# Patient Record
Sex: Female | Born: 1980 | Race: Black or African American | Hispanic: No | Smoking: Never smoker
Health system: Southern US, Community
[De-identification: ages and names within clinical notes are randomized; demographics above are authoritative.]

## PROBLEM LIST (undated history)

## (undated) ENCOUNTER — Emergency Department (HOSPITAL_BASED_OUTPATIENT_CLINIC_OR_DEPARTMENT_OTHER): Payer: Medicaid Other | Source: Home / Self Care

## (undated) ENCOUNTER — Inpatient Hospital Stay (HOSPITAL_COMMUNITY): Payer: Self-pay

## (undated) DIAGNOSIS — I517 Cardiomegaly: Secondary | ICD-10-CM

## (undated) DIAGNOSIS — Z6791 Unspecified blood type, Rh negative: Secondary | ICD-10-CM

## (undated) DIAGNOSIS — I10 Essential (primary) hypertension: Secondary | ICD-10-CM

## (undated) DIAGNOSIS — D649 Anemia, unspecified: Secondary | ICD-10-CM

## (undated) DIAGNOSIS — R51 Headache: Secondary | ICD-10-CM

## (undated) DIAGNOSIS — O26899 Other specified pregnancy related conditions, unspecified trimester: Secondary | ICD-10-CM

## (undated) HISTORY — PX: TUBAL LIGATION: SHX77

---

## 2002-10-02 ENCOUNTER — Encounter: Payer: Self-pay | Admitting: *Deleted

## 2002-10-02 ENCOUNTER — Ambulatory Visit (HOSPITAL_COMMUNITY): Admission: RE | Admit: 2002-10-02 | Discharge: 2002-10-02 | Payer: Self-pay | Admitting: *Deleted

## 2003-01-11 ENCOUNTER — Inpatient Hospital Stay (HOSPITAL_COMMUNITY): Admission: AD | Admit: 2003-01-11 | Discharge: 2003-01-12 | Payer: Self-pay | Admitting: *Deleted

## 2003-01-19 ENCOUNTER — Inpatient Hospital Stay (HOSPITAL_COMMUNITY): Admission: AD | Admit: 2003-01-19 | Discharge: 2003-01-21 | Payer: Self-pay | Admitting: *Deleted

## 2003-02-09 ENCOUNTER — Emergency Department (HOSPITAL_COMMUNITY): Admission: EM | Admit: 2003-02-09 | Discharge: 2003-02-09 | Payer: Self-pay | Admitting: Emergency Medicine

## 2012-05-21 ENCOUNTER — Encounter (HOSPITAL_COMMUNITY): Payer: Self-pay | Admitting: *Deleted

## 2012-05-21 ENCOUNTER — Inpatient Hospital Stay (HOSPITAL_COMMUNITY)
Admission: AD | Admit: 2012-05-21 | Discharge: 2012-05-21 | Disposition: A | Discharge status: Home / Self Care | Payer: Medicaid - Out of State | Source: Ambulatory Visit | Attending: Obstetrics & Gynecology | Admitting: Obstetrics & Gynecology

## 2012-05-21 DIAGNOSIS — O99891 Other specified diseases and conditions complicating pregnancy: Secondary | ICD-10-CM | POA: Insufficient documentation

## 2012-05-21 DIAGNOSIS — O479 False labor, unspecified: Secondary | ICD-10-CM

## 2012-05-21 DIAGNOSIS — R51 Headache: Secondary | ICD-10-CM | POA: Insufficient documentation

## 2012-05-21 DIAGNOSIS — R109 Unspecified abdominal pain: Secondary | ICD-10-CM | POA: Insufficient documentation

## 2012-05-21 DIAGNOSIS — O26899 Other specified pregnancy related conditions, unspecified trimester: Secondary | ICD-10-CM | POA: Diagnosis present

## 2012-05-21 DIAGNOSIS — O0930 Supervision of pregnancy with insufficient antenatal care, unspecified trimester: Secondary | ICD-10-CM | POA: Insufficient documentation

## 2012-05-21 DIAGNOSIS — O4700 False labor before 37 completed weeks of gestation, unspecified trimester: Secondary | ICD-10-CM | POA: Insufficient documentation

## 2012-05-21 HISTORY — DX: Unspecified blood type, rh negative: Z67.91

## 2012-05-21 HISTORY — DX: Anemia, unspecified: D64.9

## 2012-05-21 HISTORY — DX: Headache: R51

## 2012-05-21 HISTORY — DX: Other specified pregnancy related conditions, unspecified trimester: O26.899

## 2012-05-21 LAB — WET PREP, GENITAL
Clue Cells Wet Prep HPF POC: NONE SEEN
Trich, Wet Prep: NONE SEEN
Yeast Wet Prep HPF POC: NONE SEEN

## 2012-05-21 LAB — URINALYSIS, ROUTINE W REFLEX MICROSCOPIC
Bilirubin Urine: NEGATIVE
Glucose, UA: NEGATIVE mg/dL
Hgb urine dipstick: NEGATIVE
Ketones, ur: NEGATIVE mg/dL
Nitrite: NEGATIVE
Protein, ur: NEGATIVE mg/dL
Specific Gravity, Urine: 1.01 (ref 1.005–1.030)
Urobilinogen, UA: 1 mg/dL (ref 0.0–1.0)
pH: 7 (ref 5.0–8.0)

## 2012-05-21 LAB — URINE MICROSCOPIC-ADD ON

## 2012-05-21 NOTE — MAU Provider Note (Signed)
Chief Complaint:  Headache and Abdominal Pain   None     HPI: Christina Bruce is a 31 y.o. [phone] at 36w6dwho presents to maternity admissions reporting episodes one time each day of cramping/contractions, especially after walking or being on her feet.  These cramps go away when she rests and drinks fluids.  She has not had prenatal care in this pregnancy other than an initial visit in [place].  She reports good fetal movement, denies LOF, vaginal bleeding, vaginal itching/burning, urinary symptoms, h/a, dizziness, n/v, or fever/chills.       Past Medical History: Past Medical History  Diagnosis Date  . Anemia   . Rh negative, maternal   . Headache     Past obstetric history: G1: Vaginal delivery at term G2: C/S with twins at term G3: C/S at term G55: TAB G5: Current pregnancy  Past Surgical History: Past Surgical History  Procedure Date  . Cesarean section     Family History: Family History  Problem Relation Age of Onset  . Other Neg Hx     Social History: History  Substance Use Topics  . Smoking status: Never Smoker   . Smokeless tobacco: Not on file  . Alcohol Use: No    Allergies:  Allergies  Allergen Reactions  . Penicillins Hives    Meds:  Prescriptions prior to admission  Medication Sig Dispense Refill  . acetaminophen (TYLENOL) 325 MG tablet Take 325 mg by mouth every 6 (six) hours as needed. Takes for pain      . Prenatal Vit-Fe Fumarate-FA (PRENATAL MULTIVITAMIN) TABS Take 1 tablet by mouth every morning.        ROS: Pertinent findings in history of present illness.  Physical Exam  Blood pressure 120/84, pulse 104, temperature 98.1 F (36.7 C), temperature source Oral, resp. rate 16, height 5' (1.524 m), weight 77.111 kg (170 lb). GENERAL: Well-developed, well-nourished female in no acute distress.  HEENT: normocephalic HEART: normal rate RESP: normal effort ABDOMEN: Soft, non-tender, gravid appropriate for gestational age EXTREMITIES:  Nontender, no edema NEURO: alert and oriented Pelvic exam: Cervix pink, visually closed, without lesion, scant white creamy discharge, vaginal walls and external genitalia normal  Dilation: Closed Effacement (%): Thick Station:  (high) Exam by:: L Leftwich-Kirby CNM  FHT:  Baseline 150 , moderate variability, accelerations present (10x10), no decelerations Contractions: None   Labs: Results for orders placed during the hospital encounter of 05/21/12 (from the past 24 hour(s))  URINALYSIS, ROUTINE W REFLEX MICROSCOPIC     Status: Abnormal   Collection Time   05/21/12  1:27 PM      Component Value Range   Color, Urine YELLOW  YELLOW   APPearance HAZY (*) CLEAR   Specific Gravity, Urine 1.010  1.005 - 1.030   pH 7.0  5.0 - 8.0   Glucose, UA NEGATIVE  NEGATIVE mg/dL   Hgb urine dipstick NEGATIVE  NEGATIVE   Bilirubin Urine NEGATIVE  NEGATIVE   Ketones, ur NEGATIVE  NEGATIVE mg/dL   Protein, ur NEGATIVE  NEGATIVE mg/dL   Urobilinogen, UA 1.0  0.0 - 1.0 mg/dL   Nitrite NEGATIVE  NEGATIVE   Leukocytes, UA TRACE (*) NEGATIVE  URINE MICROSCOPIC-ADD ON     Status: Abnormal   Collection Time   05/21/12  1:27 PM      Component Value Range   Squamous Epithelial / LPF RARE  RARE   WBC, UA 3-6  <3 WBC/hpf   RBC / HPF 0-2  <3 RBC/hpf   Bacteria,  UA FEW (*) RARE  WET PREP, GENITAL     Status: Abnormal   Collection Time   05/21/12  3:20 PM      Component Value Range   Yeast Wet Prep HPF POC NONE SEEN  NONE SEEN   Trich, Wet Prep NONE SEEN  NONE SEEN   Clue Cells Wet Prep HPF POC NONE SEEN  NONE SEEN   WBC, Wet Prep HPF POC FEW (*) NONE SEEN    Assessment: 1. Prenatal care insufficient   2. Braxton Hicks contractions   3. Headache in pregnancy    Plan: Discharge home Tylenol for h/a, increase PO fluids PTL precautions and fetal kick counts Message sent to [facility] clinic Outpatient U/S scheduled Return to MAU as needed  Medication List  As of 05/21/2012  3:25 PM   ASK your doctor about  these medications         acetaminophen 325 MG tablet   Commonly known as: TYLENOL   Take 325 mg by mouth every 6 (six) hours as needed. Takes for pain      prenatal multivitamin Tabs   Take 1 tablet by mouth every morning.            Sharen Counter Certified Nurse-Midwife 05/21/2012 3:25 PM

## 2012-05-21 NOTE — MAU Note (Signed)
C/o headaches and cramping for past week; recently moved here to care for her mother; limited prenatal care in [place];

## 2012-05-22 LAB — GC/CHLAMYDIA PROBE AMP, GENITAL
Chlamydia, DNA Probe: NEGATIVE
GC Probe Amp, Genital: NEGATIVE

## 2012-05-27 ENCOUNTER — Ambulatory Visit (HOSPITAL_COMMUNITY)
Admission: RE | Admit: 2012-05-27 | Discharge: 2012-05-27 | Disposition: A | Discharge status: Home / Self Care | Payer: Medicaid - Out of State | Source: Ambulatory Visit | Attending: Advanced Practice Midwife | Admitting: Advanced Practice Midwife

## 2012-05-27 DIAGNOSIS — O0930 Supervision of pregnancy with insufficient antenatal care, unspecified trimester: Secondary | ICD-10-CM | POA: Insufficient documentation

## 2012-05-27 DIAGNOSIS — Z363 Encounter for antenatal screening for malformations: Secondary | ICD-10-CM | POA: Insufficient documentation

## 2012-05-27 DIAGNOSIS — Z1389 Encounter for screening for other disorder: Secondary | ICD-10-CM | POA: Insufficient documentation

## 2012-05-27 DIAGNOSIS — O34219 Maternal care for unspecified type scar from previous cesarean delivery: Secondary | ICD-10-CM | POA: Insufficient documentation

## 2012-05-27 DIAGNOSIS — O358XX0 Maternal care for other (suspected) fetal abnormality and damage, not applicable or unspecified: Secondary | ICD-10-CM | POA: Insufficient documentation

## 2012-06-04 ENCOUNTER — Ambulatory Visit (INDEPENDENT_AMBULATORY_CARE_PROVIDER_SITE_OTHER): Payer: Medicaid - Out of State | Admitting: Advanced Practice Midwife

## 2012-06-04 ENCOUNTER — Encounter: Payer: Self-pay | Admitting: Advanced Practice Midwife

## 2012-06-04 VITALS — BP 111/74 | Temp 98.1°F | Wt 195.7 lb

## 2012-06-04 DIAGNOSIS — M5431 Sciatica, right side: Secondary | ICD-10-CM | POA: Insufficient documentation

## 2012-06-04 DIAGNOSIS — O2390 Unspecified genitourinary tract infection in pregnancy, unspecified trimester: Secondary | ICD-10-CM

## 2012-06-04 DIAGNOSIS — Z23 Encounter for immunization: Secondary | ICD-10-CM

## 2012-06-04 DIAGNOSIS — M5430 Sciatica, unspecified side: Secondary | ICD-10-CM

## 2012-06-04 DIAGNOSIS — N390 Urinary tract infection, site not specified: Secondary | ICD-10-CM

## 2012-06-04 DIAGNOSIS — Z3480 Encounter for supervision of other normal pregnancy, unspecified trimester: Secondary | ICD-10-CM | POA: Insufficient documentation

## 2012-06-04 DIAGNOSIS — O0930 Supervision of pregnancy with insufficient antenatal care, unspecified trimester: Secondary | ICD-10-CM

## 2012-06-04 DIAGNOSIS — O2340 Unspecified infection of urinary tract in pregnancy, unspecified trimester: Secondary | ICD-10-CM | POA: Insufficient documentation

## 2012-06-04 LAB — POCT URINALYSIS DIP (DEVICE)
Bilirubin Urine: NEGATIVE
Hgb urine dipstick: NEGATIVE
Ketones, ur: NEGATIVE mg/dL
pH: 6 (ref 5.0–8.0)

## 2012-06-04 MED ORDER — NITROFURANTOIN MONOHYD MACRO 100 MG PO CAPS: 100.0000 mg | ORAL_CAPSULE | Freq: Two times a day (BID) | ORAL | Status: DC

## 2012-06-04 MED ORDER — TETANUS-DIPHTH-ACELL PERTUSSIS 5-2.5-18.5 LF-MCG/0.5 IM SUSP: 0.5000 mL | Freq: Once | INTRAMUSCULAR | Status: DC

## 2012-06-04 NOTE — Progress Notes (Signed)
P= 91, Here for initial ob. States was taking a pink birth control pill when got pregnant and stopped taking pills. Was 2 months pregnant when she found out at a doctors appointment that was because her period was coming/stopping/. C/o 1+edema feet, Given new patient information, Discussed appropriate weight gain based on her bmi- patient has already surpassed- discussed eating healthy and trying not to gain a lot more. Interested in getting tdap today.

## 2012-06-04 NOTE — Progress Notes (Signed)
Subjective:    Christina Bruce is a [id number] [redacted]w[redacted]d being seen today for her first obstetrical visit.  Her obstetrical history is significant for C/S x2. Patient does intend to breast feed. Pregnancy history fully reviewed.    Patient reports sciatic pain in right leg.  Filed Vitals:   06/04/12 0833  BP: 111/74  Temp: 98.1 F (36.7 C)  Weight: 88.769 kg (195 lb 11.2 oz)    HISTORY: OB History    Grav Para Term Preterm Abortions TAB SAB Ect Mult Living   6 4 4  1  0   1 5     # Outc Date GA Lbr Len/2nd Wgt Sex Del Anes PTL Lv   1 TRM 12/02 [redacted]w[redacted]d  7lb2oz(3.232kg) F SVD EPI     Comments: no complications, delivered in [place]   2 [facility] 5/04 [redacted]w[redacted]d  7lb4oz(3.289kg) F SVD EPI     Comments: no complications, born at [facility] hospital   3 [facility] 5/06 [redacted]w[redacted]d  8lb14oz(4.026kg) M CS EPI     Comments: c/s due to breech or unable to vaginally deliver- no complications , born at [place]   4A [facility] 3/08 [redacted]w[redacted]d  7lb6oz(3.345kg) F       Comments: Twins, born at [place], no complications   4B  3/08 [redacted]w[redacted]d  7lb11oz(3.487kg) F CS EPI     5 ABT 5/12 [redacted]w[redacted]d          Comments: no complications   6 CUR              Past Medical History  Diagnosis Date  . Anemia   . Rh negative, maternal   . Headache    Past Surgical History  Procedure Date  . Cesarean section    Family History  Problem Relation Age of Onset  . Other Neg Hx   . Hypertension Mother   . Diabetes Mother      Exam    Uterus:   23 cm fundal height  Pelvic Exam: Deferred--done in MAU   Perineum: Deferred   Vulva: Deferred   Vagina:  Deferred   pH: N/A   Cervix: Deferred   Adnexa: not evaluated   Bony Pelvis: average  System: Breast:  normal appearance, no masses or tenderness   Skin: normal coloration and turgor, no rashes    Neurologic: oriented, normal   Extremities: normal strength, tone, and muscle mass   HEENT PERRLA   Mouth/Teeth mucous membranes moist, pharynx normal without lesions   Neck  supple and no masses   Cardiovascular: regular rate and rhythm   Respiratory:  appears well, vitals normal, no respiratory distress, acyanotic, normal RR, ear and throat exam is normal, neck free of mass or lymphadenopathy, chest clear, no wheezing, crepitations, rhonchi, normal symmetric air entry   Abdomen: soft, non-tender; bowel sounds normal; no masses,  no organomegaly   Urinary: Deferred      Assessment:    Pregnancy: [id number] 1. Prenatal care insufficient   2. Sciatica of right side   3. UTI in pregnancy   4. Supervision of normal subsequent pregnancy   5. Need for prophylactic vaccination and inoculation against influenza         Plan:     Initial labs not drawn. Draw at next visit Prenatal vitamins. Problem list reviewed and updated. Genetic Screening discussed/late to caredeclined.  Ultrasound discussed; fetal survey normal  Follow up in 2 weeks. Macrobid 100 mg BID x7 days Urine culture sent 75%  of 30 min visit spent on counseling and coordination of care.     LEFTWICH-KIRBY, Dorissa Stinnette 06/04/2012

## 2012-06-04 NOTE — Patient Instructions (Signed)
Sciatica Sciatica is a weakness and/or changes in sensation (tingling, jolts, hot and cold, numbness) along the path the sciatic nerve travels. Irritation or damage to lumbar nerve roots is often also referred to as lumbar radiculopathy.  Lumbar radiculopathy (Sciatica) is the most common form of this problem. Radiculopathy can occur in any of the nerves coming out of the spinal cord. The problems caused depend on which nerves are involved. The sciatic nerve is the large nerve supplying the branches of nerves going from the hip to the toes. It often causes a numbness or weakness in the skin and/or muscles that the sciatic nerve serves. It also may cause symptoms (problems) of pain, burning, tingling, or electric shock-like feelings in the path of this nerve. This usually comes from injury to the fibers that make up the sciatic nerve. Some of these symptoms are low back pain and/or unpleasant feelings in the following areas:  From the mid-buttock down the back of the leg to the back of the knee.   And/or the outside of the calf and top of the foot.   And/or behind the inner ankle to the sole of the foot.  CAUSES   Herniated or slipped disc. Discs are the little cushions between the bones in the back.   Pressure by the piriformis muscle in the buttock on the sciatic nerve (Piriformis Syndrome).   Misalignment of the bones in the lower back and buttocks (Sacroiliac Joint Derangement).   Narrowing of the spinal canal that puts pressure on or pinches the fibers that make up the sciatic nerve.   A slipped vertebra that is out of line with those above or beneath it.   Abnormality of the nervous system itself so that nerve fibers do not transmit signals properly, especially to feet and calves (neuropathy).   Tumor (this is rare).  Your caregiver can usually determine the cause of your sciatica and begin the treatment most likely to help you. TREATMENT  Taking over-the-counter painkillers, physical  therapy, rest, exercise, spinal manipulation, and injections of anesthetics and/or steroids may be used. Surgery, acupuncture, and Yoga can also be effective. Mind over matter techniques, mental imagery, and changing factors such as your bed, chair, desk height, posture, and activities are other treatments that may be helpful. You and your caregiver can help determine what is best for you. With proper diagnosis, the cause of most sciatica can be identified and removed. Communication and cooperation between your caregiver and you is essential. If you are not successful immediately, do not be discouraged. With time, a proper treatment can be found that will make you comfortable. HOME CARE INSTRUCTIONS   If the pain is coming from a problem in the back, applying ice to that area for 15 to 20 minutes, 3 to 4 times per day while awake, may be helpful. Put the ice in a plastic bag. Place a towel between the bag of ice and your skin.   You may exercise or perform your usual activities if these do not aggravate your pain, or as suggested by your caregiver.   Only take over-the-counter or prescription medicines for pain, discomfort, or fever as directed by your caregiver.   If your caregiver has given you a follow-up appointment, it is very important to keep that appointment. Not keeping the appointment could result in a chronic or permanent injury, pain, and disability. If there is any problem keeping the appointment, you must call back to this facility for assistance.  SEEK IMMEDIATE MEDICAL CARE   IF:   You experience loss of control of bowel or bladder.   You have increasing weakness in the trunk, buttocks, or legs.   There is numbness in any areas from the hip down to the toes.   You have difficulty walking or keeping your balance.   You have any of the above, with fever or forceful vomiting.  Document Released: 08/28/2001 Document Revised: 08/23/2011 Document Reviewed: 04/16/2008 [facility] Patient  Information 2012 [place], [place].  Urinary Tract Infection in Pregnancy A urinary tract infection (UTI) is a bacterial infection of the urinary tract. Infection of the urinary tract can include the ureters, kidneys (pyelonephritis), bladder (cystitis), and urethra (urethritis). All pregnant women should be screened for bacteria in the urinary tract. Identifying and treating a UTI will decrease the risk of preterm labor and developing more serious infections in both the mother and baby. CAUSES Bacteria germs cause almost all UTIs. There are many factors that can increase your chances of getting a UTI during pregnancy. These include:  Having a short urethra.   Poor toilet and hygiene habits.   Sexual intercourse.   Blockage of urine along the urinary tract.   Problems with the pelvic muscles or nerves.   Diabetes.   Obesity.   Bladder problems after having several children.   Previous history of UTI.  SYMPTOMS   Pain, burning, or a stinging feeling when urinating.   Suddenly feeling the need to urinate right away (urgency).   Loss of bladder control (urinary incontinence).   Frequent urination, more than is common with pregnancy.   Lower abdominal or back discomfort.   Bad smelling urine.   Cloudy urine.   Blood in the urine (hematuria).   Fever.  When the kidneys are infected, the symptoms may be:  Back pain.   Flank pain on the right side more so than the left.   Fever.   Chills.   Nausea.   Vomiting.  DIAGNOSIS   Urine tests.   Additional tests and procedures may include:   Ultrasound of the kidneys, ureters, bladder, and urethra.   Looking in the bladder with a lighted tube (cystoscopy).   Certain X-ray studies only when absolutely necessary.  Finding out the results of your test Ask when your test results will be ready. Make sure you get your test results. TREATMENT  Antibiotic medicine by mouth.   Antibiotics given through the vein  (intravenously), if needed.  HOME CARE INSTRUCTIONS   Take your antibiotics as directed. Finish them even if you start to feel better. Only take medicine as directed by your caregiver.   Drink enough fluids to keep your urine clear or pale yellow.   Do not have sexual intercourse until the infection is gone and your caregiver says it is okay.   Make sure you are tested for UTIs throughout your pregnancy if you get one. These infections often come back.  Preventing a UTI in the future:  Practice good toilet habits. Always wipe from front to back. Use the tissue only once.   Do not hold your urine. Empty your bladder as soon as possible when the urge comes.   Do not douche or use deodorant sprays.   Wash with soap and warm water around the genital area and the anus.   Empty your bladder before and after sexual intercourse.   Wear underwear with a cotton crotch.   Avoid caffeine and carbonated drinks. They can irritate the bladder.   Drink cranberry juice or take cranberry  pills. This may decrease the risk of getting a UTI.   Do not drink alcohol.   Keep all your appointments and tests as scheduled.  SEEK MEDICAL CARE IF:   Your symptoms get worse.   You are still having fevers 2 or more days after treatment begins.   You develop a rash.   You feel that you are having problems with medicines prescribed.   You develop abnormal vaginal discharge.  SEEK IMMEDIATE MEDICAL CARE IF:   You develop back or flank pain.   You develop chills.   You have blood in your urine.   You develop nausea and vomiting.   You develop contractions of your uterus.   You have a gush of fluid from the vagina.  MAKE SURE YOU:   Understand these instructions.   Will watch your condition.   Will get help right away if you are not doing well or get worse.  Document Released: 12/29/2010 Document Revised: 08/23/2011 Document Reviewed: 12/29/2010 [facility] Patient Information 2012  [place], [place].

## 2012-06-05 LAB — OBSTETRIC PANEL
Eosinophils Relative: 0 % (ref 0–5)
Hepatitis B Surface Ag: NEGATIVE
Lymphocytes Relative: 21 % (ref 12–46)
Lymphs Abs: 1.6 10*3/uL (ref 0.7–4.0)
MCV: 73.2 fL — ABNORMAL LOW (ref 78.0–100.0)
Neutro Abs: 5.1 10*3/uL (ref 1.7–7.7)
Platelets: 259 10*3/uL (ref 150–400)
RBC: 3.85 MIL/uL — ABNORMAL LOW (ref 3.87–5.11)
Rubella: 61 IU/mL — ABNORMAL HIGH
WBC: 7.5 10*3/uL (ref 4.0–10.5)

## 2012-06-05 LAB — HIV ANTIBODY (ROUTINE TESTING W REFLEX): HIV: NONREACTIVE

## 2012-06-08 LAB — CULTURE, OB URINE

## 2012-06-18 ENCOUNTER — Encounter: Payer: Medicaid - Out of State | Admitting: Obstetrics and Gynecology

## 2012-11-01 ENCOUNTER — Other Ambulatory Visit: Payer: Self-pay

## 2013-07-23 ENCOUNTER — Other Ambulatory Visit: Payer: Self-pay

## 2014-07-19 ENCOUNTER — Encounter: Payer: Self-pay | Admitting: Advanced Practice Midwife

## 2014-09-17 ENCOUNTER — Encounter (HOSPITAL_COMMUNITY): Payer: Self-pay | Admitting: Emergency Medicine

## 2014-09-17 ENCOUNTER — Emergency Department (INDEPENDENT_AMBULATORY_CARE_PROVIDER_SITE_OTHER)
Admission: EM | Admit: 2014-09-17 | Discharge: 2014-09-17 | Disposition: A | Discharge status: Home / Self Care | Payer: PRIVATE HEALTH INSURANCE | Source: Home / Self Care | Attending: Family Medicine | Admitting: Family Medicine

## 2014-09-17 DIAGNOSIS — W19XXXA Unspecified fall, initial encounter: Secondary | ICD-10-CM

## 2014-09-17 DIAGNOSIS — Y92199 Unspecified place in other specified residential institution as the place of occurrence of the external cause: Secondary | ICD-10-CM

## 2014-09-17 DIAGNOSIS — M542 Cervicalgia: Secondary | ICD-10-CM

## 2014-09-17 MED ORDER — IBUPROFEN 800 MG PO TABS
ORAL_TABLET | ORAL | Status: AC
  Filled 2014-09-17: qty 1

## 2014-09-17 MED ORDER — IBUPROFEN 800 MG PO TABS
800.0000 mg | ORAL_TABLET | Freq: Once | ORAL | Status: AC
  Administered 2014-09-17: 800 mg via ORAL

## 2014-09-17 NOTE — ED Provider Notes (Signed)
CSN: [id number]     Arrival date & time 09/17/14  1305 History   First MD Initiated Contact with Patient 09/17/14 1318     No chief complaint on file.  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 Was carrying a container filled with gravy across the kitchen and slipped and fell. Landed on buttocks then fell backwards hitting back and head on floor. Gravy spilled on face, chest and upper arms. No LOC. Was advised by her director to be seen at our facility.  Reports herself to be otherwise healthy PCP: [facility] in [place], [place]   The history is provided by the patient.    Past Medical History  Diagnosis Date  . Anemia   . Rh negative, maternal   . Headache(784.0)    Past Surgical History  Procedure Laterality Date  . Cesarean section     Family History  Problem Relation Age of Onset  . Other Neg Hx   . Hypertension Mother   . Diabetes Mother    History  Substance Use Topics  . Smoking status: Never Smoker   . Smokeless tobacco: Never Used  . Alcohol Use: No   OB History    Gravida Para Term Preterm AB TAB SAB Ectopic Multiple Living   0   1 5     Review of Systems  All other systems reviewed and are negative.   Allergies  Penicillins  Home Medications   Prior to Admission medications   Medication Sig Start Date End Date Taking? Authorizing Provider  acetaminophen (TYLENOL) 325 MG tablet Take 325 mg by mouth every 6 (six) hours as needed. Takes for pain    Historical Provider, MD  nitrofurantoin, macrocrystal-monohydrate, (MACROBID) 100 MG capsule Take 1 capsule (100 mg total) by mouth 2 (two) times daily. 06/04/12   Hurshel Party, CNM  Prenatal Vit-Fe Fumarate-FA (PRENATAL MULTIVITAMIN) TABS Take 1 tablet by mouth every morning.    Historical Provider, MD   BP 143/94 mmHg  Pulse 71  Temp(Src) 98.2 F  (36.8 C) (Oral)  Resp 16  SpO2 99%  LMP 08/29/2014  Breastfeeding? Unknown Physical Exam  Constitutional: She is oriented to person, place, and time. She appears well-developed and well-nourished.  HENT:  Head: Normocephalic and atraumatic.  Right Ear: Hearing and external ear normal.  Left Ear: Hearing and external ear normal.  Nose: Nose normal.  Mouth/Throat: Uvula is midline, oropharynx is clear and moist and mucous membranes are normal.  Eyes: Conjunctivae and EOM are normal. Pupils are equal, round, and reactive to light.  Neck: Trachea normal, normal range of motion, full passive range of motion without pain and phonation normal. Neck supple. Muscular tenderness present. No spinous process tenderness present. No erythema and normal range of motion present.    Outlined area are regions of minor discomfort  Cardiovascular: Normal rate, regular rhythm and normal heart sounds.   Pulmonary/Chest: Effort normal and breath sounds normal.  Abdominal: Soft. Bowel sounds are normal. She exhibits no distension. There is no tenderness.  Musculoskeletal: Normal range of motion.  Neurological: She is alert and oriented to person, place, and time.  Skin: Skin is warm and dry.  Small linear area ofsuperficial erythema at right anterior upper arm.  Psychiatric: She has a normal mood and affect. Her behavior is normal.  Nursing note and vitals reviewed.   ED Course  Procedures (including critical care time) Labs  Review Labs Reviewed - No data to display  Imaging Review No results found.   MDM   1. Fall from standing, initial encounter    Head injury precautions reviewed with patient. Advised patient that she could expect to be stiff and sore for the next few days. Ibuprofen as directed on packaging for discomfort. Return prn.   Ria Clock, PA 09/17/14 1352

## 2014-09-17 NOTE — ED Notes (Signed)
Pain in neck and shoulders.  Larey Seat today while carrying a pan of gravy.  Supervisor was notified per patient: incident report completed

## 2014-09-17 NOTE — Discharge Instructions (Signed)
Monitor your symptoms at home. Tylenol or ibuprofen as directed on packaging for discomfort Expect to be stiff and sore for the next few days If symptoms do not improve, please follow up at [facility]

## 2014-09-18 ENCOUNTER — Emergency Department (HOSPITAL_COMMUNITY): Payer: PRIVATE HEALTH INSURANCE

## 2014-09-18 ENCOUNTER — Emergency Department (HOSPITAL_COMMUNITY)
Admission: EM | Admit: 2014-09-18 | Discharge: 2014-09-18 | Disposition: A | Discharge status: Home / Self Care | Payer: PRIVATE HEALTH INSURANCE | Attending: Emergency Medicine | Admitting: Emergency Medicine

## 2014-09-18 ENCOUNTER — Encounter (HOSPITAL_COMMUNITY): Payer: Self-pay | Admitting: Emergency Medicine

## 2014-09-18 DIAGNOSIS — M62838 Other muscle spasm: Secondary | ICD-10-CM | POA: Diagnosis not present

## 2014-09-18 DIAGNOSIS — M542 Cervicalgia: Secondary | ICD-10-CM | POA: Diagnosis not present

## 2014-09-18 DIAGNOSIS — Z79899 Other long term (current) drug therapy: Secondary | ICD-10-CM | POA: Insufficient documentation

## 2014-09-18 DIAGNOSIS — M545 Low back pain: Secondary | ICD-10-CM | POA: Insufficient documentation

## 2014-09-18 DIAGNOSIS — M546 Pain in thoracic spine: Secondary | ICD-10-CM | POA: Insufficient documentation

## 2014-09-18 DIAGNOSIS — Z880 Allergy status to penicillin: Secondary | ICD-10-CM | POA: Diagnosis not present

## 2014-09-18 DIAGNOSIS — M549 Dorsalgia, unspecified: Secondary | ICD-10-CM

## 2014-09-18 DIAGNOSIS — G8911 Acute pain due to trauma: Secondary | ICD-10-CM | POA: Diagnosis not present

## 2014-09-18 DIAGNOSIS — Z862 Personal history of diseases of the blood and blood-forming organs and certain disorders involving the immune mechanism: Secondary | ICD-10-CM | POA: Insufficient documentation

## 2014-09-18 MED ORDER — OXYCODONE-ACETAMINOPHEN 5-325 MG PO TABS
1.0000 | ORAL_TABLET | Freq: Once | ORAL | Status: AC
Start: 2014-09-18 — End: 2014-09-18
  Administered 2014-09-18: 1 via ORAL
  Filled 2014-09-18: qty 1

## 2014-09-18 MED ORDER — METHOCARBAMOL 500 MG PO TABS
500.0000 mg | ORAL_TABLET | Freq: Once | ORAL | Status: AC
  Administered 2014-09-18: 500 mg via ORAL
  Filled 2014-09-18: qty 1

## 2014-09-18 MED ORDER — NAPROXEN 500 MG PO TABS: 500.0000 mg | ORAL_TABLET | Freq: Two times a day (BID) | ORAL | Status: DC

## 2014-09-18 MED ORDER — METHOCARBAMOL 500 MG PO TABS: 500.0000 mg | ORAL_TABLET | Freq: Two times a day (BID) | ORAL | Status: DC

## 2014-09-18 NOTE — Discharge Instructions (Signed)
Recommend alternating ice and heat to areas of injury. Take Naproxen and robaxin as prescribed for symptoms. Follow up with your primary doctor for a recheck of symptoms in 1 week.  Muscle Cramps and Spasms Muscle cramps and spasms occur when a muscle or muscles tighten and you have no control over this tightening (involuntary muscle contraction). They are a common problem and can develop in any muscle. The most common place is in the calf muscles of the leg. Both muscle cramps and muscle spasms are involuntary muscle contractions, but they also have differences:   Muscle cramps are sporadic and painful. They may last a few seconds to a quarter of an hour. Muscle cramps are often more forceful and last longer than muscle spasms.  Muscle spasms may or may not be painful. They may also last just a few seconds or much longer. CAUSES  It is uncommon for cramps or spasms to be due to a serious underlying problem. In many cases, the cause of cramps or spasms is unknown. Some common causes are:   Overexertion.   Overuse from repetitive motions (doing the same thing over and over).   Remaining in a certain position for a long period of time.   Improper preparation, form, or technique while performing a sport or activity.   Dehydration.   Injury.   Side effects of some medicines.   Abnormally low levels of the salts and ions in your blood (electrolytes), especially potassium and calcium. This could happen if you are taking water pills (diuretics) or you are pregnant.  Some underlying medical problems can make it more likely to develop cramps or spasms. These include, but are not limited to:   Diabetes.   Parkinson disease.   Hormone disorders, such as thyroid problems.   Alcohol abuse.   Diseases specific to muscles, joints, and bones.   Blood vessel disease where not enough blood is getting to the muscles.  HOME CARE INSTRUCTIONS   Stay well hydrated. Drink enough water  and fluids to keep your urine clear or pale yellow.  It may be helpful to massage, stretch, and relax the affected muscle.  For tight or tense muscles, use a warm towel, heating pad, or hot shower water directed to the affected area.  If you are sore or have pain after a cramp or spasm, applying ice to the affected area may relieve discomfort.  Put ice in a plastic bag.  Place a towel between your skin and the bag.  Leave the ice on for 15-20 minutes, 03-04 times a day.  Medicines used to treat a known cause of cramps or spasms may help reduce their frequency or severity. Only take over-the-counter or prescription medicines as directed by your caregiver. SEEK MEDICAL CARE IF:  Your cramps or spasms get more severe, more frequent, or do not improve over time.  MAKE SURE YOU:   Understand these instructions.  Will watch your condition.  Will get help right away if you are not doing well or get worse. Document Released: 02/23/2002 Document Revised: 12/29/2012 Document Reviewed: 08/20/2012 [facility] Patient Information 2015 [place], [place]. This information is not intended to replace advice given to you by your health care provider. Make sure you discuss any questions you have with your health care provider. Muscle Strain A muscle strain is an injury that occurs when a muscle is stretched beyond its normal length. Usually a small number of muscle fibers are torn when this happens. Muscle strain is rated in degrees. First-degree  strains have the least amount of muscle fiber tearing and pain. Second-degree and third-degree strains have increasingly more tearing and pain.  Usually, recovery from muscle strain takes 1-2 weeks. Complete healing takes 5-6 weeks.  CAUSES  Muscle strain happens when a sudden, violent force placed on a muscle stretches it too far. This may occur with lifting, sports, or a fall.  RISK FACTORS Muscle strain is especially common in athletes.  SIGNS AND SYMPTOMS At  the site of the muscle strain, there may be:  Pain.  Bruising.  Swelling.  Difficulty using the muscle due to pain or lack of normal function. DIAGNOSIS  Your health care provider will perform a physical exam and ask about your medical history. TREATMENT  Often, the best treatment for a muscle strain is resting, icing, and applying cold compresses to the injured area.  HOME CARE INSTRUCTIONS   Use the PRICE method of treatment to promote muscle healing during the first 2-3 days after your injury. The PRICE method involves:  Protecting the muscle from being injured again.  Restricting your activity and resting the injured body part.  Icing your injury. To do this, put ice in a plastic bag. Place a towel between your skin and the bag. Then, apply the ice and leave it on from 15-20 minutes each hour. After the third day, switch to moist heat packs.  Apply compression to the injured area with a splint or elastic bandage. Be careful not to wrap it too tightly. This may interfere with blood circulation or increase swelling.  Elevate the injured body part above the level of your heart as often as you can.  Only take over-the-counter or prescription medicines for pain, discomfort, or fever as directed by your health care provider.  Warming up prior to exercise helps to prevent future muscle strains. SEEK MEDICAL CARE IF:   You have increasing pain or swelling in the injured area.  You have numbness, tingling, or a significant loss of strength in the injured area. MAKE SURE YOU:   Understand these instructions.  Will watch your condition.  Will get help right away if you are not doing well or get worse. Document Released: 09/03/2005 Document Revised: 06/24/2013 Document Reviewed: 04/02/2013 [facility] Patient Information 2015 [place], [place]. This information is not intended to replace advice given to you by your health care provider. Make sure you discuss any questions you have with  your health care provider.

## 2014-09-18 NOTE — ED Notes (Signed)
Pt has a ride home.  

## 2014-09-18 NOTE — ED Provider Notes (Signed)
CSN: [id number]     Arrival date & time 09/18/14  1935 History  This chart was scribed for non-physician practitioner working with Arby Barrette, MD by Elveria Rising, ED Scribe. This patient was seen in room WTR5/WTR5 and the patient's care was started at 8:19 PM.   Chief Complaint  Patient presents with  . Fall  . Back Pain  . Neck Pain   The history is provided by the patient. No language interpreter was used.   HPI Comments: Christina Bruce is a 34 y.o. female who presents to the Emergency Department with multiple complaints after a fall at work in [employer]'s kitchen yesterday. Patient slipped while transporting pan filled with gravy across the kitchen; patient landed on her buttocks then flat on her back. Patient reports striking her head on landing but denies loss of consciousness. Patient evaluated yesterday for the fall at [facility]. No imaging performed. Patient informed that she could expect to be stiff and sore for the next few days and discharged with 800 mg ibuprofen. Patient is now complaining of neck pain and stiffness, back pain described as aching and headache. Patient reports difficulty moving her neck due to pain severity. Patient has not taken any ibuprofen or any other medication. Patient states that she was instructed to return today if her pain did not improve. Patient reports that her pain is aggravated by left lateral rotation and sitting up straight. Patient denies bladder/bowel incontinence or numbness/ weakness in her upper or lower extremities.   Past Medical History  Diagnosis Date  . Anemia   . Rh negative, maternal   . Headache(784.0)    Past Surgical History  Procedure Laterality Date  . Cesarean section     Family History  Problem Relation Age of Onset  . Other Neg Hx   . Hypertension Mother   . Diabetes Mother    History  Substance Use Topics  . Smoking status: Never Smoker   . Smokeless tobacco: Never Used  . Alcohol Use: No   OB History     Gravida Para Term Preterm AB TAB SAB Ectopic Multiple Living   0   1 5     Review of Systems  Constitutional: Negative for fever and chills.  Eyes: Negative for visual disturbance.  Gastrointestinal: Negative for nausea and vomiting.  Musculoskeletal: Positive for myalgias, back pain and neck pain.  Skin: Negative for wound.  Neurological: Positive for headaches.   Allergies  Penicillins  Home Medications   Prior to Admission medications   Medication Sig Start Date End Date Taking? Authorizing Provider  methocarbamol (ROBAXIN) 500 MG tablet Take 1 tablet (500 mg total) by mouth 2 (two) times daily. 09/18/14   Antony Madura, PA-C  naproxen (NAPROSYN) 500 MG tablet Take 1 tablet (500 mg total) by mouth 2 (two) times daily. 09/18/14   Antony Madura, PA-C  nitrofurantoin, macrocrystal-monohydrate, (MACROBID) 100 MG capsule Take 1 capsule (100 mg total) by mouth 2 (two) times daily. Patient not taking: Reported on 09/18/2014 06/04/12   Wilmer Floor Leftwich-Kirby, CNM   Triage Vitals: BP 153/83 mmHg  Pulse 83  Temp(Src) 99 F (37.2 C) (Oral)  Resp 18  SpO2 100%  LMP 08/29/2014  Physical Exam  Constitutional: She is oriented to person, place, and time. She appears well-developed and well-nourished. No distress.  Nontoxic/nonseptic appearing.  HENT:  Head: Normocephalic and atraumatic.  Mouth/Throat: Oropharynx is clear and moist.  Eyes: Conjunctivae and EOM are normal. Pupils  are equal, round, and reactive to light. No scleral icterus.  Neck: Normal range of motion. Neck supple. No tracheal deviation present.  Tenderness to cervical paraspinal muscles bilaterally. There is an appreciable spasm on the right. No bony deformities, step-offs, or crepitus. No midline tenderness.  Cardiovascular: Normal rate, regular rhythm and intact distal pulses.   Distal radial pulse 2+ bilaterally  Pulmonary/Chest: Effort normal. No respiratory distress.  Respirations even and unlabored.   Musculoskeletal: Normal range of motion. She exhibits tenderness.  Tenderness diffusely throughout back. No bony deformities, step-offs, or crepitus to the thoracic or lumbar midline.  Neurological: She is alert and oriented to person, place, and time. No cranial nerve deficit. She exhibits normal muscle tone. Coordination normal.  GCS 15. Speech is goal oriented. No focal neurologic deficits appreciated. Patient has equal grip strength bilaterally and 5/5 strength against resistance in all major muscle groups bilaterally. She ambulates independently with steady gait.  Skin: Skin is warm and dry. No rash noted. She is not diaphoretic. No erythema. No pallor.  Psychiatric: She has a normal mood and affect. Her behavior is normal.  Nursing note and vitals reviewed.   ED Course  Procedures (including critical care time)  COORDINATION OF CARE: 8:26 PM- Plans to obtain imaging of back. Discussed treatment plan with patient at bedside and patient agreed to plan.   Labs Review Labs Reviewed - No data to display  Imaging Review Dg Cervical Spine With Flex & Extend  09/18/2014   CLINICAL DATA:  Fall yesterday. Now with back pain and neck stiffness. Initial evaluation.  EXAM: CERVICAL SPINE COMPLETE WITH FLEXION AND EXTENSION VIEWS  COMPARISON:  None.  FINDINGS: There is mild straightening of the normal cervical lordosis. No listhesis. Vertebral body heights maintained. Prevertebral soft tissues normal. No translational motion with flexion and extension. No fracture.  No significant degenerative changes identified.  IMPRESSION: No acute abnormality within the cervical spine.   Electronically Signed   By: Rise Mu M.D.   On: 09/18/2014 22:17   Dg Thoracic Spine 2 View  09/18/2014   CLINICAL DATA:  Fall yesterday. Now with back pain and neck stiffness. Initial evaluation.  EXAM: THORACIC SPINE - 2 VIEW  COMPARISON:  None.  FINDINGS: There is no evidence of thoracic spine fracture. Mild  dextroscoliosis. Alignment otherwise unremarkable. No other significant bone abnormalities are identified.  IMPRESSION: Negative.   Electronically Signed   By: Rise Mu M.D.   On: 09/18/2014 22:20   Dg Lumbar Spine Complete  09/18/2014   CLINICAL DATA:  Fall yesterday. Now with back pain and neck stiffness. Initial evaluation.  EXAM: LUMBAR SPINE - COMPLETE 4+ VIEW  COMPARISON:  None.  FINDINGS: There is no evidence of lumbar spine fracture. Alignment is normal. Intervertebral disc spaces are maintained.  IMPRESSION: Negative.   Electronically Signed   By: Rise Mu M.D.   On: 09/18/2014 22:23     EKG Interpretation None      MDM   Final diagnoses:  Neck pain  Back pain  Muscle spasms of neck    Patient presents to the emergency department for further evaluation of injuries following a fall yesterday. Patient was evaluated at urgent care for symptoms and did not receive any imaging. She has not taken anything for her symptoms since her prior evaluation. She is neurovascularly intact. No concussive symptoms. Neurologic exam nonfocal. No indication for emergent CT head. No red flags or signs concerning for cauda equina. X-ray was obtained of C/T/L spine, all  of which are negative. Patient has had improvement in symptoms over ED course with Robaxin, Percocet, and heat pad. Do not believe further emergent workup is indicated. Will manage symptoms as outpatient with NSAIDs and Robaxin. Return precautions discussed and provided. Patient agreeable to plan with no unaddressed concerns.  I personally performed the services described in this documentation, which was scribed in my presence. The recorded information has been reviewed and is accurate.   Filed Vitals:   09/18/14 1941  BP: 153/83  Pulse: 83  Temp: 99 F (37.2 C)  TempSrc: Oral  Resp: 18  SpO2: 100%     Antony Madura, PA-C 09/18/14 2233  Arby Barrette, MD 09/19/14 [phone]

## 2014-09-18 NOTE — ED Notes (Signed)
Patient transported to X-ray 

## 2014-09-18 NOTE — ED Notes (Signed)
Pt states that she fell yesterday while carrying a pan of gravy.  States that she has back pain and neck stiffness today. When asked about a visit this nurse noted in her chart from yesterday for same incident, pt states she is upset because "they didn't do no xrays or nothing and they just told me to take ibuprofen".

## 2014-09-27 ENCOUNTER — Other Ambulatory Visit: Payer: Self-pay | Admitting: Occupational Medicine

## 2014-09-27 ENCOUNTER — Ambulatory Visit (HOSPITAL_COMMUNITY): Admission: RE | Admit: 2014-09-27 | Payer: Medicaid - Out of State | Source: Ambulatory Visit

## 2014-09-27 DIAGNOSIS — T148XXA Other injury of unspecified body region, initial encounter: Secondary | ICD-10-CM

## 2014-09-29 ENCOUNTER — Ambulatory Visit (HOSPITAL_COMMUNITY)
Admission: RE | Admit: 2014-09-29 | Discharge: 2014-09-29 | Disposition: A | Discharge status: Home / Self Care | Payer: PRIVATE HEALTH INSURANCE | Source: Ambulatory Visit | Attending: Occupational Medicine | Admitting: Occupational Medicine

## 2014-09-29 DIAGNOSIS — M79601 Pain in right arm: Secondary | ICD-10-CM | POA: Diagnosis not present

## 2014-09-29 DIAGNOSIS — M542 Cervicalgia: Secondary | ICD-10-CM | POA: Diagnosis present

## 2014-09-29 DIAGNOSIS — R937 Abnormal findings on diagnostic imaging of other parts of musculoskeletal system: Secondary | ICD-10-CM | POA: Insufficient documentation

## 2014-09-29 DIAGNOSIS — T148XXA Other injury of unspecified body region, initial encounter: Secondary | ICD-10-CM

## 2014-09-29 DIAGNOSIS — M47894 Other spondylosis, thoracic region: Secondary | ICD-10-CM | POA: Insufficient documentation

## 2014-12-13 ENCOUNTER — Ambulatory Visit: Payer: Medicaid - Out of State | Admitting: Diagnostic Neuroimaging

## 2014-12-14 ENCOUNTER — Encounter: Payer: Self-pay | Admitting: Diagnostic Neuroimaging

## 2015-06-22 DIAGNOSIS — M5412 Radiculopathy, cervical region: Secondary | ICD-10-CM | POA: Insufficient documentation

## 2015-07-07 DIAGNOSIS — F321 Major depressive disorder, single episode, moderate: Secondary | ICD-10-CM | POA: Insufficient documentation

## 2015-08-31 ENCOUNTER — Ambulatory Visit (INDEPENDENT_AMBULATORY_CARE_PROVIDER_SITE_OTHER): Payer: Self-pay | Admitting: Neurology

## 2015-08-31 DIAGNOSIS — R69 Illness, unspecified: Secondary | ICD-10-CM

## 2015-08-31 NOTE — Progress Notes (Deleted)
NEUROLOGY CLINIC NEW PATIENT NOTE  NAME: Christina Bruce DOB: 05/19/1981 REFERRING PHYSICIAN: Verlon AuBoyd, Tammy Lamonica, MD  I saw Christina Bruce as a new consult in the neurovascular clinic today regarding No chief complaint on file.   HPI: Christina Bruce is a 34 y.o. female with PMH of *** who presents as a new patient for {neuro cv c/o:11025}.   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 Was carrying a container filled with gravy across the kitchen and slipped and fell. Landed on buttocks then fell backwards hitting back and head on floor. Gravy spilled on face, chest and upper arms. No LOC. Was advised by her director to be [facility].  Reports herself to be otherwise healthy.   Patient 2nd day [facility] complaining of neck pain and stiffness, back pain described as aching and headache. Patient reports difficulty moving her neck due to pain severity. Patient has not taken any ibuprofen or any other medication. Patient states that she was instructed to return today if her pain did not improve. Patient reports that her pain is aggravated by left lateral rotation and sitting up straight. Patient denies bladder/bowel incontinence or numbness/ weakness in her upper or lower extremities. Had whole spin X-ray were negative. Given robaxin, percocet and heat pad, symptoms improved. Discharged with NSAIDs and robaxin. *** Had cervical MRI 09/29/14 showed mild disc bulging at C5-7.   Pt continued to have symptoms ***.    Past Medical History  Diagnosis Date  . Anemia   . Rh negative, maternal   . Headache(784.0)    Past Surgical History  Procedure Laterality Date  . Cesarean section     Family History  Problem Relation Age of Onset  . Other Neg Hx   . Hypertension Mother   . Diabetes Mother    Current Outpatient Prescriptions  Medication Sig Dispense Refill  . methocarbamol (ROBAXIN) 500 MG tablet Take 1 tablet (500 mg total) by mouth 2 (two) times daily.  20 tablet 0  . naproxen (NAPROSYN) 500 MG tablet Take 1 tablet (500 mg total) by mouth 2 (two) times daily. 30 tablet 0  . nitrofurantoin, macrocrystal-monohydrate, (MACROBID) 100 MG capsule Take 1 capsule (100 mg total) by mouth 2 (two) times daily. (Patient not taking: Reported on 09/18/2014) 14 capsule 0   No current facility-administered medications for this visit.   Allergies  Allergen Reactions  . Penicillins Hives   Social History   Social History  . Marital Status: Single    Spouse Name: N/A  . Number of Children: N/A  . Years of Education: N/A   Occupational History  . Not on file.   Social History Main Topics  . Smoking status: Never Smoker   . Smokeless tobacco: Never Used  . Alcohol Use: No  . Drug Use: No  . Sexual Activity: Yes    Birth Control/ Protection: Pill   Other Topics Concern  . Not on file   Social History Narrative    Review of Systems Full 14 system review of systems performed and notable only for those listed, all others are neg:  Constitutional:   Cardiovascular:  Ear/Nose/Throat:   Skin:  Eyes:   Respiratory:   Gastroitestinal:   Genitourinary:  Hematology/Lymphatic:   Endocrine:  Musculoskeletal:   Allergy/Immunology:   Neurological:   Psychiatric:  Sleep:    Physical Exam  There were no vitals filed for this visit.  General - Well nourished,  well developed, in no apparent distress***.  Ophthalmologic - Sharp disc margins OU***.  Cardiovascular - Regular rate and rhythm with no murmur***. Carotid pulses were 2+ without bruits ***.   Neck - supple, no nuchal rigidity ***.  Mental Status -  Level of arousal and orientation to time, place, and person were intact***. Language including expression, naming, repetition, comprehension, reading, and writing was assessed and found intact***. Attention span and concentration were normal***. Recent and remote memory were intact***. Fund of Knowledge was assessed and was  intact***.  Cranial Nerves II - XII - II - Visual field intact OU***. III, IV, VI - Extraocular movements intact***. V - Facial sensation intact bilaterally***. VII - Facial movement intact bilaterally***. VIII - Hearing & vestibular intact bilaterally***. X - Palate elevates symmetrically***. XI - Chin turning & shoulder shrug intact bilaterally***. XII - Tongue protrusion intact***.  Motor Strength - The patient's strength was normal in all extremities and pronator drift was absent.  Bulk was normal and fasciculations were absent***.   Motor Tone - Muscle tone was assessed at the neck and appendages and was normal***.  Reflexes - The patient's reflexes were normal in all extremities and she had no pathological reflexes***.  Sensory - Light touch, temperature/pinprick, vibration and proprioception, and Romberg testing were assessed and were normal***.    Coordination - The patient had normal movements in the hands and feet with no ataxia or dysmetria.  Tremor was absent***.  Gait and Station - The patient's transfers, posture, gait, station, and turns were observed as normal***.   Imaging  I have personally reviewed the radiological images below and agree with the radiology interpretations.  ***  Lab Review ***  Assessments: NIHSS: {0-42:17997} Rankin: {0-5:140013}   Assessment and Plan:   In summary, CHIANTI GOH is a 34 y.o. female with PMH of *** presents    I recommend aggressive blood pressure control with a goal <130/80 mm Hg.  Lipids should be managed intensively, with a goal LDL < 70 mg/dL.  I encouraged the patient to discuss these important issues with her primary care physician.  I counseled the patient on measures to reduce stroke risk, including the importance of medication compliance, risk factor control, exercise, healthy diet, and avoidance of smoking.  I reviewed stroke warning signs and symptoms and appropriate actions to take if such occurs.   Thank  you very much for the opportunity to participate in the care of this patient.  Please do not hesitate to call if any questions or concerns arise.  No orders of the defined types were placed in this encounter.    No orders of the defined types were placed in this encounter.    There are no Patient Instructions on file for this visit.  Marvel Plan, MD PhD [facility] [address], [address] [place], [place] [address] [phone]

## 2015-09-01 ENCOUNTER — Encounter: Payer: Self-pay | Admitting: Neurology

## 2015-09-27 ENCOUNTER — Encounter: Payer: Self-pay | Admitting: Neurology

## 2015-09-27 NOTE — Progress Notes (Signed)
No show

## 2015-12-06 DIAGNOSIS — G43009 Migraine without aura, not intractable, without status migrainosus: Secondary | ICD-10-CM | POA: Insufficient documentation

## 2015-12-08 DIAGNOSIS — R42 Dizziness and giddiness: Secondary | ICD-10-CM | POA: Insufficient documentation

## 2015-12-08 DIAGNOSIS — G5601 Carpal tunnel syndrome, right upper limb: Secondary | ICD-10-CM | POA: Insufficient documentation

## 2016-08-25 ENCOUNTER — Encounter: Payer: Self-pay | Admitting: Emergency Medicine

## 2016-08-25 ENCOUNTER — Emergency Department
Admission: EM | Admit: 2016-08-25 | Discharge: 2016-08-25 | Disposition: A | Discharge status: Home / Self Care | Payer: Medicaid Other | Attending: Emergency Medicine | Admitting: Emergency Medicine

## 2016-08-25 ENCOUNTER — Emergency Department: Payer: Medicaid Other

## 2016-08-25 DIAGNOSIS — Z791 Long term (current) use of non-steroidal anti-inflammatories (NSAID): Secondary | ICD-10-CM | POA: Diagnosis not present

## 2016-08-25 DIAGNOSIS — N390 Urinary tract infection, site not specified: Secondary | ICD-10-CM | POA: Diagnosis not present

## 2016-08-25 DIAGNOSIS — R109 Unspecified abdominal pain: Secondary | ICD-10-CM

## 2016-08-25 DIAGNOSIS — R1032 Left lower quadrant pain: Secondary | ICD-10-CM | POA: Diagnosis present

## 2016-08-25 LAB — URINALYSIS, COMPLETE (UACMP) WITH MICROSCOPIC
BILIRUBIN URINE: NEGATIVE
GLUCOSE, UA: NEGATIVE mg/dL
KETONES UR: NEGATIVE mg/dL
Nitrite: POSITIVE — AB
PH: 5 (ref 5.0–8.0)
Protein, ur: 100 mg/dL — AB
SPECIFIC GRAVITY, URINE: 1.02 (ref 1.005–1.030)

## 2016-08-25 LAB — CBC WITH DIFFERENTIAL/PLATELET
BASOS ABS: 0 10*3/uL (ref 0–0.1)
Basophils Relative: 1 %
EOS ABS: 0 10*3/uL (ref 0–0.7)
EOS PCT: 0 %
HCT: 40 % (ref 35.0–47.0)
Hemoglobin: 13.2 g/dL (ref 12.0–16.0)
LYMPHS PCT: 19 %
Lymphs Abs: 1.2 10*3/uL (ref 1.0–3.6)
MCH: 24.3 pg — AB (ref 26.0–34.0)
MCHC: 32.9 g/dL (ref 32.0–36.0)
MCV: 74 fL — AB (ref 80.0–100.0)
MONO ABS: 1 10*3/uL — AB (ref 0.2–0.9)
Monocytes Relative: 14 %
Neutro Abs: 4.5 10*3/uL (ref 1.4–6.5)
Neutrophils Relative %: 66 %
PLATELETS: 309 10*3/uL (ref 150–440)
RBC: 5.4 MIL/uL — AB (ref 3.80–5.20)
RDW: 16.3 % — AB (ref 11.5–14.5)
WBC: 6.7 10*3/uL (ref 3.6–11.0)

## 2016-08-25 LAB — COMPREHENSIVE METABOLIC PANEL
ALT: 28 U/L (ref 14–54)
AST: 28 U/L (ref 15–41)
Albumin: 3.9 g/dL (ref 3.5–5.0)
Alkaline Phosphatase: 135 U/L — ABNORMAL HIGH (ref 38–126)
Anion gap: 9 (ref 5–15)
BUN: 7 mg/dL (ref 6–20)
CHLORIDE: 100 mmol/L — AB (ref 101–111)
CO2: 26 mmol/L (ref 22–32)
Calcium: 9.6 mg/dL (ref 8.9–10.3)
Creatinine, Ser: 0.87 mg/dL (ref 0.44–1.00)
Glucose, Bld: 118 mg/dL — ABNORMAL HIGH (ref 65–99)
POTASSIUM: 3.5 mmol/L (ref 3.5–5.1)
SODIUM: 135 mmol/L (ref 135–145)
Total Bilirubin: 0.7 mg/dL (ref 0.3–1.2)
Total Protein: 8.8 g/dL — ABNORMAL HIGH (ref 6.5–8.1)

## 2016-08-25 LAB — LIPASE, BLOOD: LIPASE: 17 U/L (ref 11–51)

## 2016-08-25 LAB — POCT PREGNANCY, URINE: Preg Test, Ur: NEGATIVE

## 2016-08-25 MED ORDER — NITROFURANTOIN MACROCRYSTAL 100 MG PO CAPS: 100.0000 mg | ORAL_CAPSULE | Freq: Four times a day (QID) | ORAL | 0 refills | Status: AC

## 2016-08-25 NOTE — ED Provider Notes (Signed)
Time Seen: Approximately 1724 I have reviewed the triage notes  Chief Complaint: Abdominal Pain   History of Present Illness: Christina Bruce is a 35 y.o. female who presents with 24 hours of left-sided abdominal pain that has gotten progressively worse. She denies any nausea, vomiting. States she's had some low-grade fevers at home up to 100.3. She states she took some ibuprofen prior to arrival which does help with both the pain and the fever. She denies any dysuria, hematuria or urinary frequency. She denies any vaginal discharge or bleeding. She denies any right-sided abdominal pain.   Past Medical History:  Diagnosis Date  . Anemia   . Headache(784.0)   . Rh negative, maternal     Patient Active Problem List   Diagnosis Date Noted  . Sciatica of right side 06/04/2012  . UTI in pregnancy 06/04/2012  . Supervision of normal subsequent pregnancy 06/04/2012  . Prenatal care insufficient 05/21/2012  . Headache in pregnancy 05/21/2012    Past Surgical History:  Procedure Laterality Date  . CESAREAN SECTION      Past Surgical History:  Procedure Laterality Date  . CESAREAN SECTION      Current Outpatient Rx  . Order #: [id number] Class: Print  . Order #: [id number] Class: Print  . Order #: [id number] Class: Print  . Order #: [id number] Class: Print    Allergies:  Penicillins  Family History: Family History  Problem Relation Age of Onset  . Hypertension Mother   . Diabetes Mother   . Other Neg Hx     Social History: Social History  Substance Use Topics  . Smoking status: Never Smoker  . Smokeless tobacco: Never Used  . Alcohol use No     Review of Systems:   10 point review of systems was performed and was otherwise negative:  Constitutional: No fever Eyes: No visual disturbances ENT: No sore throat, ear pain Cardiac: No chest pain Respiratory: She states she had some shortness of breath when she had the fever last night but denies any wheezing, stridor or  productive cough Abdomen: No abdominal pain, no vomiting, No diarrhea Endocrine: No weight loss, No night sweats Extremities: No peripheral edema, cyanosis Skin: No rashes, easy bruising Neurologic: No focal weakness, trouble with speech or swollowing Urologic: No dysuria, Hematuria, or urinary frequency   Physical Exam:  ED Triage Vitals  Enc Vitals Group     BP 08/25/16 1639 (!) 104/95     Pulse Rate 08/25/16 1800 99     Resp 08/25/16 1639 18     Temp 08/25/16 1639 98.7 F (37.1 C)     Temp Source 08/25/16 1639 Oral     SpO2 08/25/16 1639 98 %     Weight 08/25/16 1639 202 lb (91.6 kg)     Height 08/25/16 1639 5' (1.524 m)     Head Circumference --      Peak Flow --      Pain Score 08/25/16 1640 6     Pain Loc --      Pain Edu? --      Excl. in GC? --     General: Awake , Alert , and Oriented times 3; GCS 15 Head: Normal cephalic , atraumatic Eyes: Pupils equal , round, reactive to light Nose/Throat: No nasal drainage, patent upper airway without erythema or exudate.  Neck: Supple, Full range of motion, No anterior adenopathy or palpable thyroid masses Lungs: Clear to ascultation without wheezes , rhonchi, or rales Heart: Regular rate, regular rhythm without  murmurs , gallops , or rubs Abdomen: Soft, non tender without rebound, guarding , or rigidity; bowel sounds positive and symmetric in all 4 quadrants. No organomegaly .        Extremities: 2 plus symmetric pulses. No edema, clubbing or cyanosis Neurologic: normal ambulation, Motor symmetric without deficits, sensory intact Skin: warm, dry, no rashes   Labs:   All laboratory work was reviewed including any pertinent negatives or positives listed below:  Labs Reviewed  CBC WITH DIFFERENTIAL/PLATELET - Abnormal; Notable for the following:       Result Value   RBC 5.40 (*)    MCV 74.0 (*)    MCH 24.3 (*)    RDW 16.3 (*)    Monocytes Absolute 1.0 (*)    All other components within normal limits  COMPREHENSIVE  METABOLIC PANEL - Abnormal; Notable for the following:    Chloride 100 (*)    Glucose, Bld 118 (*)    Total Protein 8.8 (*)    Alkaline Phosphatase 135 (*)    All other components within normal limits  URINALYSIS, COMPLETE (UACMP) WITH MICROSCOPIC - Abnormal; Notable for the following:    Color, Urine AMBER (*)    APPearance CLOUDY (*)    Hgb urine dipstick LARGE (*)    Protein, ur 100 (*)    Nitrite POSITIVE (*)    Leukocytes, UA LARGE (*)    Squamous Epithelial / LPF 0-5 (*)    Bacteria, UA RARE (*)    All other components within normal limits  URINE CULTURE  LIPASE, BLOOD  POC URINE PREG, ED  POCT PREGNANCY, URINE  Patient appears to have a urinary tract infection and urine culture is pending.  Radiology:  "Ct Renal Stone Study  Result Date: 08/25/2016 CLINICAL DATA:  Left flank pain. EXAM: CT ABDOMEN AND PELVIS WITHOUT CONTRAST TECHNIQUE: Multidetector CT imaging of the abdomen and pelvis was performed following the standard protocol without IV contrast. COMPARISON:  None. FINDINGS: Lower chest: No acute abnormality. Hepatobiliary: A small stone is seen in the gallbladder. The liver is normal in appearance. Pancreas: Unremarkable. No pancreatic ductal dilatation or surrounding inflammatory changes. Spleen: Normal in size without focal abnormality. Adrenals/Urinary Tract: 2 small stones are seen in the right kidney a 4 mm stone is seen in the left kidney. No hydronephrosis or perinephric stranding. No ureterectasis or ureteral stones. The bladder is poorly distended but unremarkable. Stomach/Bowel: The stomach and small bowel are normal. The colon is normal in appearance. The appendix is unremarkable. Vascular/Lymphatic: No significant vascular findings are present. No enlarged abdominal or pelvic lymph nodes. Reproductive: Uterus and bilateral adnexa are unremarkable. Other: There is diastases of the rectus muscles with anterior bulging of the anterior abdominal wall and a small fat  containing umbilical hernia. No free air or free fluid. Musculoskeletal: No acute or significant osseous findings. IMPRESSION: 1. Nonobstructive stones in the kidneys. No evidence of renal obstruction or ureteral stone. 2. No cause for left-sided pain identified. 3. Anterior abdominal wall laxity. Electronically Signed   By: Gerome Samavid  Williams III M.D   On: 08/25/2016 18:00  "  I personally reviewed the radiologic studies    ED Course:  Patient's stay here was uneventful and given her current clinical presentation and objective findings appears she has good non-complicated urinary tract infection. Patient does not have evidence of pyelonephritis at this time. She does not appear to have an obstructive stone etc. I felt we could treat the patient as an outpatient and was given  a prescription for antibiotic therapy. Clinical Course      Assessment: * Urinary tract infection   Final Clinical Impression:   Final diagnoses:  Left sided abdominal pain  Lower urinary tract infectious disease     Plan: * Outpatient " Discharge Medication List as of 08/25/2016  6:18 PM    START taking these medications   Details  nitrofurantoin (MACRODANTIN) 100 MG capsule Take 1 capsule (100 mg total) by mouth 4 (four) times daily., Starting Sat 08/25/2016, Until Sat 09/01/2016, Print      " Patient was advised to return immediately if condition worsens. Patient was advised to follow up with their primary care physician or other specialized physicians involved in their outpatient care. The patient and/or family member/power of attorney had laboratory results reviewed at the bedside. All questions and concerns were addressed and appropriate discharge instructions were distributed by the nursing staff.             Jennye Moccasin, MD 08/25/16 [phone]

## 2016-08-25 NOTE — ED Notes (Signed)
Pt reports taking ibuprofen at 1530

## 2016-08-25 NOTE — Discharge Instructions (Signed)
Please return immediately if condition worsens. Please contact her primary physician or the physician you were given for referral. If you have any specialist physicians involved in her treatment and plan please also contact them. Thank you for using [place] [facility].  Please drink plenty of fluids and he can drink over-the-counter cranberry juice which will help decrease the pH of the urine to help fight infection.

## 2016-08-25 NOTE — ED Triage Notes (Signed)
C/o LLQ pain since last pm and fever.

## 2016-08-28 LAB — URINE CULTURE

## 2016-08-29 NOTE — Progress Notes (Signed)
ED Antimicrobial Stewardship Positive Culture Follow Up   Christina Bruce is an 35 y.o. female who [facility] with a chief complaint of UTI.   Chief Complaint  Patient presents with  . Abdominal Pain    Recent Results (from the past 720 hour(s))  Urine culture     Status: Abnormal   Collection Time: 08/25/16  4:43 PM  Result Value Ref Range Status   Specimen Description URINE, CLEAN CATCH  Final   Special Requests NONE  Final   Culture >=100,000 COLONIES/mL ESCHERICHIA COLI (A)  Final   Report Status 08/28/2016 FINAL  Final   Organism ID, Bacteria ESCHERICHIA COLI (A)  Final      Susceptibility   Escherichia coli - MIC*    AMPICILLIN 8 SENSITIVE Sensitive     CEFAZOLIN <=4 SENSITIVE Sensitive     CEFTRIAXONE <=1 SENSITIVE Sensitive     CIPROFLOXACIN <=0.25 SENSITIVE Sensitive     GENTAMICIN <=1 SENSITIVE Sensitive     IMIPENEM <=0.25 SENSITIVE Sensitive     NITROFURANTOIN <=16 SENSITIVE Sensitive     TRIMETH/SULFA <=20 SENSITIVE Sensitive     AMPICILLIN/SULBACTAM <=2 SENSITIVE Sensitive     PIP/TAZO <=4 SENSITIVE Sensitive     Extended ESBL NEGATIVE Sensitive     * >=100,000 COLONIES/mL ESCHERICHIA COLI    Patient prescribed nitrofurantoin on discharge. Urine culture positive for Ecoli sensitive to nitrofurantion. No adjustments to prescribed therapy warranted based on culture results.    Simpson,Michael L 08/29/2016, 10:58 AM [phone]

## 2020-04-18 ENCOUNTER — Encounter (HOSPITAL_BASED_OUTPATIENT_CLINIC_OR_DEPARTMENT_OTHER): Payer: Self-pay | Admitting: *Deleted

## 2020-04-18 ENCOUNTER — Emergency Department (HOSPITAL_BASED_OUTPATIENT_CLINIC_OR_DEPARTMENT_OTHER)
Admission: EM | Admit: 2020-04-18 | Discharge: 2020-04-18 | Disposition: A | Discharge status: Home / Self Care | Payer: Medicaid Other | Attending: Emergency Medicine | Admitting: Emergency Medicine

## 2020-04-18 ENCOUNTER — Emergency Department (HOSPITAL_BASED_OUTPATIENT_CLINIC_OR_DEPARTMENT_OTHER): Payer: Medicaid Other

## 2020-04-18 ENCOUNTER — Other Ambulatory Visit: Payer: Self-pay

## 2020-04-18 DIAGNOSIS — S46911A Strain of unspecified muscle, fascia and tendon at shoulder and upper arm level, right arm, initial encounter: Secondary | ICD-10-CM | POA: Diagnosis not present

## 2020-04-18 DIAGNOSIS — M25519 Pain in unspecified shoulder: Secondary | ICD-10-CM

## 2020-04-18 DIAGNOSIS — M2550 Pain in unspecified joint: Secondary | ICD-10-CM | POA: Diagnosis not present

## 2020-04-18 DIAGNOSIS — Y999 Unspecified external cause status: Secondary | ICD-10-CM | POA: Insufficient documentation

## 2020-04-18 DIAGNOSIS — M791 Myalgia, unspecified site: Secondary | ICD-10-CM | POA: Insufficient documentation

## 2020-04-18 DIAGNOSIS — W109XXA Fall (on) (from) unspecified stairs and steps, initial encounter: Secondary | ICD-10-CM | POA: Diagnosis not present

## 2020-04-18 DIAGNOSIS — M25511 Pain in right shoulder: Secondary | ICD-10-CM | POA: Diagnosis not present

## 2020-04-18 DIAGNOSIS — W19XXXA Unspecified fall, initial encounter: Secondary | ICD-10-CM

## 2020-04-18 DIAGNOSIS — Y929 Unspecified place or not applicable: Secondary | ICD-10-CM | POA: Insufficient documentation

## 2020-04-18 DIAGNOSIS — Y939 Activity, unspecified: Secondary | ICD-10-CM | POA: Insufficient documentation

## 2020-04-18 DIAGNOSIS — S40921A Unspecified superficial injury of right upper arm, initial encounter: Secondary | ICD-10-CM | POA: Diagnosis present

## 2020-04-18 DIAGNOSIS — S59901A Unspecified injury of right elbow, initial encounter: Secondary | ICD-10-CM | POA: Diagnosis not present

## 2020-04-18 MED ORDER — NAPROXEN 500 MG PO TABS: 500.0000 mg | ORAL_TABLET | Freq: Two times a day (BID) | ORAL | 0 refills | Status: AC

## 2020-04-18 MED ORDER — IBUPROFEN 800 MG PO TABS
800.0000 mg | ORAL_TABLET | Freq: Once | ORAL | Status: AC
  Administered 2020-04-18: 800 mg via ORAL
  Filled 2020-04-18: qty 1

## 2020-04-18 NOTE — ED Triage Notes (Signed)
Fell yesterday.  Limited movement to right arm.

## 2020-04-18 NOTE — ED Provider Notes (Signed)
[facility] EMERGENCY DEPARTMENT Provider Note   CSN: [id number] Arrival date & time: 04/18/20  1108     History Chief Complaint  Patient presents with   Marletta Lor    Christina Bruce is a 39 y.o. female.  39 year old female presents with complaint of pain in her right arm.  Patient states that she tripped and fell down 4 steps yesterday landing on her right forearm.  Patient was sore after the accident however noticed significant pain in her arm today with movement of the arm.  No other injuries, complaints, concerns.        Past Medical History:  Diagnosis Date   Anemia    Headache(784.0)    Rh negative, maternal     Patient Active Problem List   Diagnosis Date Noted   Sciatica of right side 06/04/2012   UTI in pregnancy 06/04/2012   Supervision of normal subsequent pregnancy 06/04/2012   Prenatal care insufficient 05/21/2012   Headache in pregnancy 05/21/2012    Past Surgical History:  Procedure Laterality Date   CESAREAN SECTION       OB History    Gravida  6   Para  4   Term  4   Preterm      AB  1   Living  5     SAB      TAB  0   Ectopic      Multiple  1   Live Births              Family History  Problem Relation Age of Onset   Hypertension Mother    Diabetes Mother    Other Neg Hx     Social History   Tobacco Use   Smoking status: Never Smoker   Smokeless tobacco: Never Used  Substance Use Topics   Alcohol use: No   Drug use: No    Home Medications Prior to Admission medications   Medication Sig Start Date End Date Taking? Authorizing Provider  methocarbamol (ROBAXIN) 500 MG tablet Take 1 tablet (500 mg total) by mouth 2 (two) times daily. 09/18/14   Antony Madura, PA-C  naproxen (NAPROSYN) 500 MG tablet Take 1 tablet (500 mg total) by mouth 2 (two) times daily for 10 days. 04/18/20 04/28/20  Jeannie Fend, PA-C  nitrofurantoin, macrocrystal-monohydrate, (MACROBID) 100 MG capsule Take 1 capsule (100  mg total) by mouth 2 (two) times daily. Patient not taking: Reported on 09/18/2014 06/04/12   Hurshel Party, CNM    Allergies    Penicillins  Review of Systems   Review of Systems  Constitutional: Negative for fever.  Musculoskeletal: Positive for arthralgias and myalgias. Negative for joint swelling, neck pain and neck stiffness.  Skin: Negative for color change, rash and wound.  Allergic/Immunologic: Negative for immunocompromised state.  Neurological: Negative for weakness and numbness.    Physical Exam Updated Vital Signs BP (!) 159/101 (BP Location: Left Arm)    Pulse 81    Temp 98.6 F (37 C) (Oral)    Resp 16    Ht 5' (1.524 m)    Wt 90.7 kg    LMP 04/11/2020    SpO2 100%    BMI 39.06 kg/m   Physical Exam Vitals and nursing note reviewed.  Constitutional:      General: She is not in acute distress.    Appearance: She is well-developed. She is not diaphoretic.  HENT:     Head: Normocephalic and atraumatic.  Cardiovascular:  Pulses: Normal pulses.  Pulmonary:     Effort: Pulmonary effort is normal.  Musculoskeletal:        General: Tenderness present. No swelling or deformity.       Arms:     Cervical back: Normal. No tenderness or bony tenderness.     Comments: Tenderness of the right trapezius area through the shoulder as well as with palpation of the biceps and triceps extending into the posterior right elbow.  Pain is worse with flexion extension of the elbow.  Patient is able to pronate and supinate the hand without difficulty, no numbness or weakness, brisk capillary refill, strong radial pulses, sensation intact.  Skin:    General: Skin is warm and dry.     Capillary Refill: Capillary refill takes less than 2 seconds.     Findings: No erythema or rash.  Neurological:     Mental Status: She is alert and oriented to person, place, and time.     Sensory: No sensory deficit.     Motor: No weakness.  Psychiatric:        Behavior: Behavior normal.      ED Results / Procedures / Treatments   Labs (all labs ordered are listed, but only abnormal results are displayed) Labs Reviewed - No data to display  EKG None  Radiology DG Shoulder Right  Result Date: 04/18/2020 CLINICAL DATA:  Right shoulder pain and limited range of motion since a fall yesterday. Initial encounter. EXAM: RIGHT SHOULDER - 2+ VIEW COMPARISON:  None. FINDINGS: There is no evidence of fracture or dislocation. There is no evidence of arthropathy or other focal bone abnormality. Soft tissues are unremarkable. IMPRESSION: Normal exam. Electronically Signed   By: Drusilla Kanner M.D.   On: 04/18/2020 12:03   DG Elbow Complete Right  Result Date: 04/18/2020 CLINICAL DATA:  Fall EXAM: RIGHT ELBOW - COMPLETE 3+ VIEW COMPARISON:  None. FINDINGS: There is no evidence of fracture, dislocation, or joint effusion. There is no evidence of arthropathy or other focal bone abnormality. Soft tissues are unremarkable. IMPRESSION: Negative. Electronically Signed   By: Guadlupe Spanish M.D.   On: 04/18/2020 13:37    Procedures Procedures (including critical care time)  Medications Ordered in ED Medications  ibuprofen (ADVIL) tablet 800 mg (800 mg Oral Given 04/18/20 1320)    ED Course  I have reviewed the triage vital signs and the nursing notes.  Pertinent labs & imaging results that were available during my care of the patient were reviewed by me and considered in my medical decision making (see chart for details).  Clinical Course as of Apr 18 1348  Mon Apr 18, 2020  [id number] 39 year old female with mechanical fall yesterday down 4 steps injuring her right arm.  Patient has mostly soft tissue tenderness over biceps and triceps also has tenderness over the olecranon without crepitus, swelling, effusion.  X-ray of the right shoulder and right elbow are negative for acute bony injury.  Patient was given ibuprofen for pain.  Recommend she continue with Motrin and Tylenol as needed as  directed, follow-up with sports medicine if not improving after a week.   [LM]    Clinical Course User Index [LM] Alden Hipp   MDM Rules/Calculators/A&P                          Final Clinical Impression(s) / ED Diagnoses Final diagnoses:  Fall, initial encounter  Muscle strain of right upper arm, initial  encounter    Rx / DC Orders ED Discharge Orders         Ordered    naproxen (NAPROSYN) 500 MG tablet  2 times daily     Discontinue  Reprint     04/18/20 1348           Jeannie Fend, PA-C 04/18/20 1349    Alvira Monday, MD 04/18/20 2246

## 2020-04-18 NOTE — Discharge Instructions (Signed)
Apply cool compress for the next 24 hours then switch to warm compresses.  Apply compress for 20 minutes at a time at least 3 times daily. Take Naproxen and Tylenol as needed as directed for pain. Follow-up with sports medicine if pain is not improving after a week, referral given.

## 2020-04-28 ENCOUNTER — Telehealth: Payer: Self-pay | Admitting: Family Medicine

## 2020-04-28 DIAGNOSIS — S53401A Unspecified sprain of right elbow, initial encounter: Secondary | ICD-10-CM | POA: Diagnosis not present

## 2020-04-28 DIAGNOSIS — S43401A Unspecified sprain of right shoulder joint, initial encounter: Secondary | ICD-10-CM | POA: Diagnosis not present

## 2020-04-28 NOTE — Telephone Encounter (Signed)
Unable to leave pt message regarding ED f/u care for Fall  -- no answer @ listed ph#, unable to leave voicemail / message.  --fyi.  --glh

## 2020-12-23 DIAGNOSIS — M25511 Pain in right shoulder: Secondary | ICD-10-CM | POA: Diagnosis not present

## 2020-12-23 DIAGNOSIS — G5621 Lesion of ulnar nerve, right upper limb: Secondary | ICD-10-CM | POA: Diagnosis not present

## 2021-05-24 DIAGNOSIS — R030 Elevated blood-pressure reading, without diagnosis of hypertension: Secondary | ICD-10-CM | POA: Diagnosis not present

## 2021-05-24 DIAGNOSIS — R000 Tachycardia, unspecified: Secondary | ICD-10-CM | POA: Diagnosis not present

## 2021-05-24 LAB — TSH: TSH: 1.22 (ref 0.41–5.90)

## 2021-06-13 DIAGNOSIS — E538 Deficiency of other specified B group vitamins: Secondary | ICD-10-CM | POA: Insufficient documentation

## 2021-06-17 ENCOUNTER — Emergency Department (HOSPITAL_BASED_OUTPATIENT_CLINIC_OR_DEPARTMENT_OTHER)
Admission: EM | Admit: 2021-06-17 | Discharge: 2021-06-17 | Disposition: A | Discharge status: Home / Self Care | Payer: Medicaid Other | Attending: Emergency Medicine | Admitting: Emergency Medicine

## 2021-06-17 ENCOUNTER — Emergency Department (HOSPITAL_BASED_OUTPATIENT_CLINIC_OR_DEPARTMENT_OTHER): Payer: Medicaid Other

## 2021-06-17 ENCOUNTER — Encounter (HOSPITAL_BASED_OUTPATIENT_CLINIC_OR_DEPARTMENT_OTHER): Payer: Self-pay | Admitting: Emergency Medicine

## 2021-06-17 DIAGNOSIS — N200 Calculus of kidney: Secondary | ICD-10-CM | POA: Insufficient documentation

## 2021-06-17 DIAGNOSIS — R109 Unspecified abdominal pain: Secondary | ICD-10-CM | POA: Diagnosis present

## 2021-06-17 DIAGNOSIS — K8020 Calculus of gallbladder without cholecystitis without obstruction: Secondary | ICD-10-CM | POA: Diagnosis not present

## 2021-06-17 LAB — URINALYSIS, ROUTINE W REFLEX MICROSCOPIC
Bilirubin Urine: NEGATIVE
Glucose, UA: NEGATIVE mg/dL
Hgb urine dipstick: NEGATIVE
Ketones, ur: NEGATIVE mg/dL
Leukocytes,Ua: NEGATIVE
Nitrite: NEGATIVE
Protein, ur: NEGATIVE mg/dL
Specific Gravity, Urine: 1.025 (ref 1.005–1.030)
pH: 7 (ref 5.0–8.0)

## 2021-06-17 LAB — COMPREHENSIVE METABOLIC PANEL
ALT: 20 U/L (ref 0–44)
AST: 21 U/L (ref 15–41)
Albumin: 3.7 g/dL (ref 3.5–5.0)
Alkaline Phosphatase: 108 U/L (ref 38–126)
Anion gap: 8 (ref 5–15)
BUN: 8 mg/dL (ref 6–20)
CO2: 26 mmol/L (ref 22–32)
Calcium: 9.3 mg/dL (ref 8.9–10.3)
Chloride: 101 mmol/L (ref 98–111)
Creatinine, Ser: 0.79 mg/dL (ref 0.44–1.00)
GFR, Estimated: >60 mL/min (ref >60)
Glucose, Bld: 102 mg/dL — ABNORMAL HIGH (ref 70–99)
Potassium: 3.5 mmol/L (ref 3.5–5.1)
Sodium: 135 mmol/L (ref 135–145)
Total Bilirubin: 0.1 mg/dL — ABNORMAL LOW (ref 0.3–1.2)
Total Protein: 7.9 g/dL (ref 6.5–8.1)

## 2021-06-17 LAB — CBC WITH DIFFERENTIAL/PLATELET
Abs Immature Granulocytes: 0.03 10*3/uL (ref 0.00–0.07)
Basophils Absolute: 0.1 10*3/uL (ref 0.0–0.1)
Basophils Relative: 1 %
Eosinophils Absolute: 0.1 10*3/uL (ref 0.0–0.5)
Eosinophils Relative: 1 %
HCT: 33.3 % — ABNORMAL LOW (ref 36.0–46.0)
Hemoglobin: 10.4 g/dL — ABNORMAL LOW (ref 12.0–15.0)
Immature Granulocytes: 0 %
Lymphocytes Relative: 38 %
Lymphs Abs: 3.1 10*3/uL (ref 0.7–4.0)
MCH: 20.5 pg — ABNORMAL LOW (ref 26.0–34.0)
MCHC: 31.2 g/dL (ref 30.0–36.0)
MCV: 65.6 fL — ABNORMAL LOW (ref 80.0–100.0)
Monocytes Absolute: 0.7 10*3/uL (ref 0.1–1.0)
Monocytes Relative: 9 %
Neutro Abs: 4.2 10*3/uL (ref 1.7–7.7)
Neutrophils Relative %: 51 %
Platelets: 428 10*3/uL — ABNORMAL HIGH (ref 150–400)
RBC: 5.08 MIL/uL (ref 3.87–5.11)
RDW: 19 % — ABNORMAL HIGH (ref 11.5–15.5)
WBC: 8.2 10*3/uL (ref 4.0–10.5)
nRBC: 0 % (ref 0.0–0.2)

## 2021-06-17 LAB — PREGNANCY, URINE: Preg Test, Ur: NEGATIVE

## 2021-06-17 LAB — LIPASE, BLOOD: Lipase: 28 U/L (ref 11–51)

## 2021-06-17 MED ORDER — ONDANSETRON 4 MG PO TBDP: 4.0000 mg | ORAL_TABLET | Freq: Three times a day (TID) | ORAL | 0 refills | Status: DC | PRN

## 2021-06-17 MED ORDER — MORPHINE SULFATE (PF) 4 MG/ML IV SOLN
4.0000 mg | Freq: Once | INTRAVENOUS | Status: AC
  Administered 2021-06-17: 4 mg via INTRAVENOUS
  Filled 2021-06-17: qty 1

## 2021-06-17 MED ORDER — TAMSULOSIN HCL 0.4 MG PO CAPS: 0.4000 mg | ORAL_CAPSULE | Freq: Every day | ORAL | 0 refills | Status: DC

## 2021-06-17 MED ORDER — ONDANSETRON HCL 4 MG/2ML IJ SOLN
4.0000 mg | Freq: Once | INTRAMUSCULAR | Status: AC
  Administered 2021-06-17: 4 mg via INTRAVENOUS
  Filled 2021-06-17: qty 2

## 2021-06-17 MED ORDER — ONDANSETRON HCL 4 MG/2ML IJ SOLN
4.0000 mg | Freq: Once | INTRAMUSCULAR | Status: DC
Start: 2021-06-17 — End: 2021-06-18

## 2021-06-17 MED ORDER — SODIUM CHLORIDE 0.9 % IV BOLUS
1000.0000 mL | Freq: Once | INTRAVENOUS | Status: AC
  Administered 2021-06-17: 1000 mL via INTRAVENOUS

## 2021-06-17 MED ORDER — OXYCODONE-ACETAMINOPHEN 5-325 MG PO TABS: 1.0000 | ORAL_TABLET | Freq: Four times a day (QID) | ORAL | 0 refills | Status: DC | PRN

## 2021-06-17 NOTE — Discharge Instructions (Addendum)
Take Flomax once daily, take the pain medicine every 6-8 hours as needed for pain.  Once you are feeling better you can return back to work.  The stone should pass in the next few days to a week, but has not improved or if it worsens please return back to the ED as needed.  Strain your urine and collect the stone, bring it to your primary care doctor office for analysis.

## 2021-06-17 NOTE — ED Triage Notes (Signed)
Pt reports LT flank pain since Wed; she has tried muscle relaxer and a laxative, but no change in pain; denies urinary sxs

## 2021-06-17 NOTE — ED Provider Notes (Signed)
[facility] EMERGENCY DEPARTMENT Provider Note   CSN: [id number] Arrival date & time: 06/17/21  1934     History Chief Complaint  Patient presents with  . Flank Pain    Christina Bruce is a 40 y.o. female.   Flank Pain Pertinent negatives include no chest pain, no abdominal pain and no shortness of breath.    Patient presents with left flank pain.  Started acutely on Wednesday, has been constant since then for the last 3 days.  Aggravated by moving, eating, drinking.  It feels sharp, does not radiate elsewhere.  Patient reports she recently started iron supplements due to being anemic, was initially worried about constipation took a laxative without any relief.  She also has tried Tylenol and Aleve without any relief.  No associated nausea, vomiting, dysuria, hematuria, fevers.   Past Medical History:  Diagnosis Date  . Anemia   . Headache(784.0)   . Rh negative, maternal     Patient Active Problem List   Diagnosis Date Noted  . Sciatica of right side 06/04/2012  . UTI in pregnancy 06/04/2012  . Supervision of normal subsequent pregnancy 06/04/2012  . Prenatal care insufficient 05/21/2012  . Headache in pregnancy 05/21/2012    Past Surgical History:  Procedure Laterality Date  . CESAREAN SECTION       OB History     Gravida  6   Para  4   Term  4   Preterm      AB  1   Living  5      SAB      IAB  0   Ectopic      Multiple  1   Live Births              Family History  Problem Relation Age of Onset  . Hypertension Mother   . Diabetes Mother   . Other Neg Hx     Social History   Tobacco Use  . Smoking status: Never  . Smokeless tobacco: Never  Substance Use Topics  . Alcohol use: No  . Drug use: No    Home Medications Prior to Admission medications   Medication Sig Start Date End Date Taking? Authorizing Provider  methocarbamol (ROBAXIN) 500 MG tablet Take 1 tablet (500 mg total) by mouth 2 (two) times daily. 09/18/14    Antony Madura, PA-C  nitrofurantoin, macrocrystal-monohydrate, (MACROBID) 100 MG capsule Take 1 capsule (100 mg total) by mouth 2 (two) times daily. Patient not taking: Reported on 09/18/2014 06/04/12   Hurshel Party, CNM    Allergies    Penicillins  Review of Systems   Review of Systems  Constitutional:  Negative for chills and fever.  HENT:  Negative for ear pain and sore throat.   Eyes:  Negative for pain and visual disturbance.  Respiratory:  Negative for cough and shortness of breath.   Cardiovascular:  Negative for chest pain and palpitations.  Gastrointestinal:  Negative for abdominal pain, nausea and vomiting.  Genitourinary:  Positive for flank pain. Negative for dysuria and hematuria.  Musculoskeletal:  Negative for arthralgias and back pain.  Skin:  Negative for color change and rash.  Neurological:  Negative for seizures and syncope.  All other systems reviewed and are negative.  Physical Exam Updated Vital Signs BP (!) 151/107   Pulse 90   Temp 98.4 F (36.9 C) (Oral)   Resp 20   Ht 5\' 1"  (1.549 m)   Wt 101.2 kg  LMP 06/10/2021   SpO2 99%   BMI 42.14 kg/m   Physical Exam Vitals and nursing note reviewed. Exam conducted with a chaperone present.  Constitutional:      General: She is not in acute distress.    Appearance: Normal appearance.  HENT:     Head: Normocephalic and atraumatic.  Eyes:     General: No scleral icterus.       Right eye: No discharge.        Left eye: No discharge.     Extraocular Movements: Extraocular movements intact.     Pupils: Pupils are equal, round, and reactive to light.  Cardiovascular:     Rate and Rhythm: Normal rate and regular rhythm.     Pulses: Normal pulses.     Heart sounds: Normal heart sounds. No murmur heard.   No friction rub. No gallop.  Pulmonary:     Effort: Pulmonary effort is normal. No respiratory distress.     Breath sounds: Normal breath sounds.  Abdominal:     General: Abdomen is flat.  Bowel sounds are normal. There is no distension.     Palpations: Abdomen is soft.     Tenderness: There is no abdominal tenderness. There is left CVA tenderness. There is no guarding.  Skin:    General: Skin is warm and dry.     Coloration: Skin is not jaundiced.  Neurological:     Mental Status: She is alert. Mental status is at baseline.     Coordination: Coordination normal.    ED Results / Procedures / Treatments   Labs (all labs ordered are listed, but only abnormal results are displayed) Labs Reviewed - No data to display  EKG None  Radiology No results found.  Procedures Procedures   Medications Ordered in ED Medications - No data to display  ED Course  I have reviewed the triage vital signs and the nursing notes.  Pertinent labs & imaging results that were available during my care of the patient were reviewed by me and considered in my medical decision making (see chart for details).    MDM Rules/Calculators/A&P                           Patient is mildly hypertensive, otherwise vitals are stable.  She does have positive left CVA tenderness, no guarding rigidity on exam, doubt any acute abdomen.  Abdominal pain could be due to constipation, could be a side effect no recent medication changes, could be muscle strain, can also be UTI.  Doubt Pilo given no fever, nausea or vomiting..  We will proceed with labs and do CT renal to assess for kidney stone.  No leukocytosis or anemia.  Lack of leukocytosis reassuring against any acute intra-abdominal infection or inflammation.  UA is negative for any hemoglobin that be suggestive of a kidney stone, no signs of a UTI.  No gross electrolyte derangement, no AKI, no elevated LFTs concerning for biliary obstruction or hepatitis.  Patient is not pregnant.  Patient is 2 mm kidney stone.  No leukocytosis, no fever-doubt this is infected or septic stone.  Patient vitals are stable, pain is improved.  Stable for discharge.  Final  Clinical Impression(s) / ED Diagnoses Final diagnoses:  None    Rx / DC Orders ED Discharge Orders     None        Theron Arista, PA-C 06/17/21 2128    Rolan Bucco, MD 06/17/21 2148

## 2021-06-18 ENCOUNTER — Telehealth (HOSPITAL_BASED_OUTPATIENT_CLINIC_OR_DEPARTMENT_OTHER): Payer: Self-pay | Admitting: Emergency Medicine

## 2021-06-18 MED ORDER — OXYCODONE-ACETAMINOPHEN 5-325 MG PO TABS: 1.0000 | ORAL_TABLET | ORAL | 0 refills | Status: DC | PRN

## 2021-06-18 NOTE — Telephone Encounter (Signed)
Patient reportedly called emergency department that the pharmacy did not get the prescription for the Percocet as it was written yesterday.  I spoke to the pharmacy myself and they do report they did not get the prescription somehow.  Patient has not filled the prescription elsewhere so we canceled the first 1 in her chart and sent a second prescription in.  Patient will fill this.

## 2021-06-19 ENCOUNTER — Telehealth: Payer: Self-pay

## 2021-06-19 NOTE — Telephone Encounter (Signed)
Transition Care Management Unsuccessful Follow-up Telephone Call  Date of discharge and from where:  06/17/2021-[facility]   Attempts:  1st Attempt  Reason for unsuccessful TCM follow-up call:  Unable to reach patient

## 2021-06-20 NOTE — Telephone Encounter (Signed)
Transition Care Management Unsuccessful Follow-up Telephone Call  Date of discharge and from where:  06/17/2021 from [facility]  Attempts:  2nd Attempt  Reason for unsuccessful TCM follow-up call:  Unable to reach patient

## 2021-06-21 NOTE — Telephone Encounter (Signed)
Transition Care Management Unsuccessful Follow-up Telephone Call  Date of discharge and from where:  06/17/2021 from [facility]  Attempts:  3rd Attempt  Reason for unsuccessful TCM follow-up call:  Unable to reach patient

## 2021-08-23 ENCOUNTER — Ambulatory Visit: Payer: Medicaid Other | Admitting: Internal Medicine

## 2021-10-19 ENCOUNTER — Emergency Department (HOSPITAL_BASED_OUTPATIENT_CLINIC_OR_DEPARTMENT_OTHER)
Admission: EM | Admit: 2021-10-19 | Discharge: 2021-10-19 | Disposition: A | Discharge status: Home / Self Care | Payer: Medicaid Other | Attending: Emergency Medicine | Admitting: Emergency Medicine

## 2021-10-19 ENCOUNTER — Other Ambulatory Visit: Payer: Self-pay

## 2021-10-19 ENCOUNTER — Encounter (HOSPITAL_BASED_OUTPATIENT_CLINIC_OR_DEPARTMENT_OTHER): Payer: Self-pay | Admitting: *Deleted

## 2021-10-19 DIAGNOSIS — N200 Calculus of kidney: Secondary | ICD-10-CM

## 2021-10-19 DIAGNOSIS — R109 Unspecified abdominal pain: Secondary | ICD-10-CM | POA: Diagnosis present

## 2021-10-19 DIAGNOSIS — Z79899 Other long term (current) drug therapy: Secondary | ICD-10-CM | POA: Diagnosis not present

## 2021-10-19 LAB — URINALYSIS, ROUTINE W REFLEX MICROSCOPIC
Bilirubin Urine: NEGATIVE
Glucose, UA: NEGATIVE mg/dL
Hgb urine dipstick: NEGATIVE
Ketones, ur: NEGATIVE mg/dL
Leukocytes,Ua: NEGATIVE
Nitrite: NEGATIVE
Protein, ur: NEGATIVE mg/dL
Specific Gravity, Urine: >1.03 — ABNORMAL HIGH (ref 1.005–1.030)
pH: 6.5 (ref 5.0–8.0)

## 2021-10-19 LAB — CBC WITH DIFFERENTIAL/PLATELET
Abs Immature Granulocytes: 0.03 10*3/uL (ref 0.00–0.07)
Basophils Absolute: 0 10*3/uL (ref 0.0–0.1)
Basophils Relative: 1 %
Eosinophils Absolute: 0.1 10*3/uL (ref 0.0–0.5)
Eosinophils Relative: 1 %
HCT: 33.9 % — ABNORMAL LOW (ref 36.0–46.0)
Hemoglobin: 10.4 g/dL — ABNORMAL LOW (ref 12.0–15.0)
Immature Granulocytes: 0 %
Lymphocytes Relative: 33 %
Lymphs Abs: 2.6 10*3/uL (ref 0.7–4.0)
MCH: 20 pg — ABNORMAL LOW (ref 26.0–34.0)
MCHC: 30.7 g/dL (ref 30.0–36.0)
MCV: 65.1 fL — ABNORMAL LOW (ref 80.0–100.0)
Monocytes Absolute: 0.8 10*3/uL (ref 0.1–1.0)
Monocytes Relative: 10 %
Neutro Abs: 4.5 10*3/uL (ref 1.7–7.7)
Neutrophils Relative %: 55 %
Platelets: 442 10*3/uL — ABNORMAL HIGH (ref 150–400)
RBC: 5.21 MIL/uL — ABNORMAL HIGH (ref 3.87–5.11)
RDW: 18.7 % — ABNORMAL HIGH (ref 11.5–15.5)
WBC: 8 10*3/uL (ref 4.0–10.5)
nRBC: 0 % (ref 0.0–0.2)

## 2021-10-19 LAB — BASIC METABOLIC PANEL
Anion gap: 9 (ref 5–15)
BUN: 10 mg/dL (ref 6–20)
CO2: 26 mmol/L (ref 22–32)
Calcium: 9.2 mg/dL (ref 8.9–10.3)
Chloride: 103 mmol/L (ref 98–111)
Creatinine, Ser: 0.66 mg/dL (ref 0.44–1.00)
GFR, Estimated: >60 mL/min (ref >60)
Glucose, Bld: 87 mg/dL (ref 70–99)
Potassium: 3.6 mmol/L (ref 3.5–5.1)
Sodium: 138 mmol/L (ref 135–145)

## 2021-10-19 LAB — PREGNANCY, URINE: Preg Test, Ur: NEGATIVE

## 2021-10-19 MED ORDER — TAMSULOSIN HCL 0.4 MG PO CAPS: 0.4000 mg | ORAL_CAPSULE | Freq: Every day | ORAL | 0 refills | Status: DC

## 2021-10-19 MED ORDER — KETOROLAC TROMETHAMINE 15 MG/ML IJ SOLN
15.0000 mg | Freq: Once | INTRAMUSCULAR | Status: AC
  Administered 2021-10-19: 15 mg via INTRAVENOUS
  Filled 2021-10-19: qty 1

## 2021-10-19 MED ORDER — ONDANSETRON 8 MG PO TBDP: 8.0000 mg | ORAL_TABLET | Freq: Three times a day (TID) | ORAL | 0 refills | Status: DC | PRN

## 2021-10-19 MED ORDER — OXYCODONE-ACETAMINOPHEN 5-325 MG PO TABS
1.0000 | ORAL_TABLET | Freq: Once | ORAL | Status: AC
  Administered 2021-10-19: 1 via ORAL
  Filled 2021-10-19: qty 1

## 2021-10-19 MED ORDER — OXYCODONE-ACETAMINOPHEN 5-325 MG PO TABS: 1.0000 | ORAL_TABLET | Freq: Three times a day (TID) | ORAL | 0 refills | Status: DC | PRN

## 2021-10-19 NOTE — ED Notes (Signed)
Pt A&XOX4 ambulatory at d/c with independent steady gait, NAD. Pt verbalized understanding of d/c instructions, prescription and follow up care.

## 2021-10-19 NOTE — ED Triage Notes (Signed)
Left flank pain x 3 days. Hx of kidney stone recently.

## 2021-10-19 NOTE — Discharge Instructions (Signed)
We suspect that your symptoms is because of kidney stones, however we do not have clear evidence of it.  Please take the medications prescribed. Return to the ER if you start having severe pain or fevers or chills.

## 2021-10-19 NOTE — ED Provider Notes (Signed)
[facility] EMERGENCY DEPARTMENT Provider Note   CSN: [id number] Arrival date & time: 10/19/21  1528     History  Chief Complaint  Patient presents with   Flank Pain    Christina Bruce is a 41 y.o. female.  HPI     41 year old female comes in with chief complaint of flank pain. She indicates that the left flank pain started about 3 days ago.  The pain is constant and rated at about 8 out of 10.  No specific evoking, aggravating or relieving factors.  She denies any burning with urination, blood in the urine, vaginal discharge or bleeding.  She had similar pain few months ago, had come to the ER and was told that she had kidney stone.  Her pain improved with the medications prescribed at that time, and she actually collected a stone in her urine.  The pain resolved after the stone passed.  This pain is exactly the same.  Pt has no hx of PE, DVT and denies any exogenous hormone (testosterone / estrogen) use, long distance travels or surgery in the past 6 weeks, active cancer, recent immobilization.   Home Medications Prior to Admission medications   Medication Sig Start Date End Date Taking? Authorizing Provider  ondansetron (ZOFRAN-ODT) 8 MG disintegrating tablet Take 1 tablet (8 mg total) by mouth every 8 (eight) hours as needed for nausea. 10/19/21  Yes Derwood Kaplan, MD  oxyCODONE-acetaminophen (PERCOCET/ROXICET) 5-325 MG tablet Take 1 tablet by mouth every 8 (eight) hours as needed for severe pain. 10/19/21  Yes Derwood Kaplan, MD  tamsulosin (FLOMAX) 0.4 MG CAPS capsule Take 1 capsule (0.4 mg total) by mouth daily. 10/19/21  Yes Verginia Toohey, MD  FEROSUL 325 (65 Fe) MG tablet Take 325 mg by mouth daily. 06/13/21   [provider]  metoprolol succinate (TOPROL-XL) 25 MG 24 hr tablet Take 25 mg by mouth daily. 06/13/21   [provider]  pantoprazole (PROTONIX) 20 MG tablet Take 20 mg by mouth 2 (two) times daily. 06/13/21   [provider]  SV  VITAMIN B-12 ER 1000 MCG TBCR Take 1 tablet by mouth daily. 06/13/21   [provider]      Allergies    Penicillins    Review of Systems   Review of Systems  All other systems reviewed and are negative.  Physical Exam Updated Vital Signs BP (!) 151/98    Pulse 82    Temp 98 F (36.7 C) (Oral)    Resp 18    Ht 5\' 1"  (1.549 m)    Wt 101.2 kg    LMP 10/12/2021    SpO2 98%    BMI 42.16 kg/m  Physical Exam Vitals and nursing note reviewed.  Constitutional:      Appearance: She is well-developed.  HENT:     Head: Atraumatic.  Cardiovascular:     Rate and Rhythm: Normal rate.  Pulmonary:     Effort: Pulmonary effort is normal.     Breath sounds: No rhonchi or rales.  Abdominal:     General: There is no distension.     Tenderness: There is no abdominal tenderness. There is left CVA tenderness.  Musculoskeletal:     Cervical back: Normal range of motion and neck supple.  Skin:    General: Skin is warm and dry.  Neurological:     Mental Status: She is alert and oriented to person, place, and time.    ED Results / Procedures / Treatments  Labs (all labs ordered are listed, but only abnormal results are displayed) Labs Reviewed  URINALYSIS, ROUTINE W REFLEX MICROSCOPIC - Abnormal; Notable for the following components:      Result Value   APPearance CLOUDY (*)    Specific Gravity, Urine >1.030 (*)    All other components within normal limits  CBC WITH DIFFERENTIAL/PLATELET - Abnormal; Notable for the following components:   RBC 5.21 (*)    Hemoglobin 10.4 (*)    HCT 33.9 (*)    MCV 65.1 (*)    MCH 20.0 (*)    RDW 18.7 (*)    Platelets 442 (*)    All other components within normal limits  BASIC METABOLIC PANEL  PREGNANCY, URINE    EKG None  Radiology No results found.  Procedures Procedures    Medications Ordered in ED Medications  ketorolac (TORADOL) 15 MG/ML injection 15 mg (15 mg Intravenous Given 10/19/21 1740)  oxyCODONE-acetaminophen  (PERCOCET/ROXICET) 5-325 MG per tablet 1 tablet (1 tablet Oral Given 10/19/21 1928)    ED Course/ Medical Decision Making/ A&P                           Medical Decision Making 41 year old female comes in with chief complaint of left flank pain.  History of kidney stone on the same side.  She has no UTI-like symptoms.  Abdominal exam is reassuring.  She does not have any pleuritic complaints.  No PE risk factors and no pleurisy.  Patient is quite sure that her pain is similar to her kidney stone pain. I reviewed patient's prior CT scan, it appeared that she had a left-sided 3 mm nephrolithiasis.  The stone was nonobstructive.  She indicates that when she urinated, she actually caught the stone and felt better.  Patient does not have any abdominal pain or tenderness.  In addition to nephrolithiasis, we considered pyelonephritis, diverticulitis, PE in the differential diagnosis, however patient does not have history supporting those diagnoses, and the exam is also not concerning for them.  Other possibility includes musculoskeletal pain, but again patient does not have reproducible pain with palpation or movement.  Discussed with the patient that nonobstructive kidney stone can sometimes be painful, other times they are not.  We will start with basic labs including urine analysis and renal function and then we will reassess her to see if she wants to proceed with CT scan to evaluate for stone or not.  Upon reassessment, patient informed me that the IV Toradol did help her pain, although he has not alleviated it.  She has decided not to proceed with CT scan given the radiation risk and given that her current pain is very similar to the pain she had with kidney stone before.  As such, patient aware that we cannot give her a definitive diagnosis -but will be happy to treat her at the same way she was treated last time with prescription meds.  She will return to the ER if her symptoms get worse or she  starts developing fevers, chills, bloody urine  Problems Addressed: Flank pain: undiagnosed new problem with uncertain prognosis Nephrolithiasis: acute illness or injury with systemic symptoms  Amount and/or Complexity of Data Reviewed External Data Reviewed: radiology. Labs: ordered. Decision-making details documented in ED Course.  Risk Prescription drug management.    Final Clinical Impression(s) / ED Diagnoses Final diagnoses:  Nephrolithiasis  Flank pain    Rx / DC Orders ED Discharge Orders  Ordered    oxyCODONE-acetaminophen (PERCOCET/ROXICET) 5-325 MG tablet  Every 8 hours PRN        10/19/21 1926    tamsulosin (FLOMAX) 0.4 MG CAPS capsule  Daily        10/19/21 1926    ondansetron (ZOFRAN-ODT) 8 MG disintegrating tablet  Every 8 hours PRN        10/19/21 1926              Derwood Kaplan, MD 10/19/21 1958

## 2021-12-13 ENCOUNTER — Encounter: Payer: Self-pay | Admitting: Internal Medicine

## 2021-12-13 ENCOUNTER — Ambulatory Visit: Payer: Medicaid Other | Admitting: Internal Medicine

## 2021-12-13 VITALS — BP 144/96 | HR 90 | Temp 98.2°F | Ht 61.0 in | Wt 229.0 lb

## 2021-12-13 DIAGNOSIS — I10 Essential (primary) hypertension: Secondary | ICD-10-CM

## 2021-12-13 DIAGNOSIS — D500 Iron deficiency anemia secondary to blood loss (chronic): Secondary | ICD-10-CM | POA: Diagnosis not present

## 2021-12-13 DIAGNOSIS — I119 Hypertensive heart disease without heart failure: Secondary | ICD-10-CM

## 2021-12-13 LAB — CBC WITH DIFFERENTIAL/PLATELET
Basophils Absolute: 0 10*3/uL (ref 0.0–0.1)
Basophils Relative: 0.5 % (ref 0.0–3.0)
Eosinophils Absolute: 0.1 10*3/uL (ref 0.0–0.7)
Eosinophils Relative: 1 % (ref 0.0–5.0)
HCT: 32.4 % — ABNORMAL LOW (ref 36.0–46.0)
Hemoglobin: 10.4 g/dL — ABNORMAL LOW (ref 12.0–15.0)
Lymphocytes Relative: 27 % (ref 12.0–46.0)
Lymphs Abs: 2.4 10*3/uL (ref 0.7–4.0)
MCHC: 31.9 g/dL (ref 30.0–36.0)
MCV: 64.5 fl — ABNORMAL LOW (ref 78.0–100.0)
Monocytes Absolute: 0.9 10*3/uL (ref 0.1–1.0)
Monocytes Relative: 9.8 % (ref 3.0–12.0)
Neutro Abs: 5.6 10*3/uL (ref 1.4–7.7)
Neutrophils Relative %: 61.7 % (ref 43.0–77.0)
Platelets: 384 10*3/uL (ref 150.0–400.0)
RBC: 5.03 Mil/uL (ref 3.87–5.11)
RDW: 19.8 % — ABNORMAL HIGH (ref 11.5–15.5)
WBC: 9 10*3/uL (ref 4.0–10.5)

## 2021-12-13 LAB — IBC + FERRITIN
Ferritin: 7.5 ng/mL — ABNORMAL LOW (ref 10.0–291.0)
Iron: 29 ug/dL — ABNORMAL LOW (ref 42–145)
Saturation Ratios: 5.9 % — ABNORMAL LOW (ref 20.0–50.0)
TIBC: 490 ug/dL — ABNORMAL HIGH (ref 250.0–450.0)
Transferrin: 350 mg/dL (ref 212.0–360.0)

## 2021-12-13 LAB — BASIC METABOLIC PANEL
BUN: 8 mg/dL (ref 6–23)
CO2: 28 mEq/L (ref 19–32)
Calcium: 9.7 mg/dL (ref 8.4–10.5)
Chloride: 101 mEq/L (ref 96–112)
Creatinine, Ser: 0.72 mg/dL (ref 0.40–1.20)
GFR: 106.16 mL/min (ref >60.00)
Glucose, Bld: 94 mg/dL (ref 70–99)
Potassium: 3.7 mEq/L (ref 3.5–5.1)
Sodium: 137 mEq/L (ref 135–145)

## 2021-12-13 LAB — MAGNESIUM: Magnesium: 1.7 mg/dL (ref 1.5–2.5)

## 2021-12-13 MED ORDER — ACCRUFER 30 MG PO CAPS: 1.0000 | ORAL_CAPSULE | Freq: Two times a day (BID) | ORAL | 1 refills | Status: DC

## 2021-12-13 MED ORDER — SPIRONOLACTONE 25 MG PO TABS: 25.0000 mg | ORAL_TABLET | Freq: Every day | ORAL | 0 refills | Status: DC

## 2021-12-13 MED ORDER — AMLODIPINE BESYLATE 5 MG PO TABS: 5.0000 mg | ORAL_TABLET | Freq: Every day | ORAL | 0 refills | Status: DC

## 2021-12-13 NOTE — Progress Notes (Signed)
? ?Subjective:  ?Patient ID: Christina Bruce, female    DOB: April 27, 1981  Age: 41 y.o. MRN: [id number] ? ?CC: Hypertension and Anemia ? ?This visit occurred during the SARS-CoV-2 public health emergency.  Safety protocols were in place, including screening questions prior to the visit, additional usage of staff PPE, and extensive cleaning of exam room while observing appropriate contact time as indicated for disinfecting solutions.   ? ?HPI ?Christina Bruce presents for establishing. ? ?Christina Bruce has been treated by someone else for hypertension with a beta-blocker but Christina Bruce is not taking it because it made her feel drowsy.  Christina Bruce admits her blood pressure is not well controlled and Christina Bruce has had a few episodes of headache recently. ? ?History ?Christina Bruce has a past medical history of Anemia, Headache(784.0), and Rh negative, maternal.  ? ?Christina Bruce has a past surgical history that includes Cesarean section and Tubal ligation (Bilateral).  ? ?Her family history includes Diabetes in her mother; Hypertension in her father and mother; Thyroid disease in her sister.Christina Bruce reports that Christina Bruce has never smoked. Christina Bruce has never been exposed to tobacco smoke. Christina Bruce has never used smokeless tobacco. Christina Bruce reports that Christina Bruce does not drink alcohol and does not use drugs. ? ?Outpatient Medications Prior to Visit  ?Medication Sig Dispense Refill  ? pantoprazole (PROTONIX) 20 MG tablet Take 20 mg by mouth 2 (two) times daily.    ? SV VITAMIN B-12 ER 1000 MCG TBCR Take 1 tablet by mouth daily.    ? tamsulosin (FLOMAX) 0.4 MG CAPS capsule Take 1 capsule (0.4 mg total) by mouth daily. 10 capsule 0  ? FEROSUL 325 (65 Fe) MG tablet Take 325 mg by mouth daily.    ? metoprolol succinate (TOPROL-XL) 25 MG 24 hr tablet Take 25 mg by mouth daily.    ? ondansetron (ZOFRAN-ODT) 8 MG disintegrating tablet Take 1 tablet (8 mg total) by mouth every 8 (eight) hours as needed for nausea. 20 tablet 0  ? oxyCODONE-acetaminophen (PERCOCET/ROXICET) 5-325 MG tablet Take 1 tablet by mouth  every 8 (eight) hours as needed for severe pain. 9 tablet 0  ? ?No facility-administered medications prior to visit.  ? ? ?ROS ?Review of Systems  ?Constitutional: Negative.  Negative for chills, diaphoresis and fatigue.  ?HENT: Negative.    ?Eyes:  Negative for visual disturbance.  ?Respiratory:  Negative for apnea, cough, shortness of breath and wheezing.   ?     No snoring  ?Cardiovascular:  Negative for chest pain, palpitations and leg swelling.  ?Gastrointestinal:  Negative for abdominal pain and diarrhea.  ?Genitourinary: Negative.  Negative for difficulty urinating.  ?Musculoskeletal: Negative.   ?Skin: Negative.   ?Neurological:  Positive for headaches. Negative for dizziness, weakness and light-headedness.  ?Hematological:  Negative for adenopathy. Does not bruise/bleed easily.  ? ?Objective:  ?BP (!) 144/96   Pulse 90   Temp 98.2 ?F (36.8 ?C) (Oral)   Ht 5\' 1"  (1.549 m)   Wt 229 lb (103.9 kg)   LMP 11/13/2021 (Exact Date)   SpO2 97%   BMI 43.27 kg/m?  ? ?Physical Exam ?Constitutional:   ?   Appearance: Christina Bruce is obese.  ?HENT:  ?   Nose: Nose normal.  ?   Mouth/Throat:  ?   Mouth: Mucous membranes are moist.  ?Eyes:  ?   General: No scleral icterus. ?   Conjunctiva/sclera: Conjunctivae normal.  ?Cardiovascular:  ?   Rate and Rhythm: Normal rate and regular rhythm.  ?   Heart sounds: Normal heart  sounds, S1 normal and S2 normal. No murmur heard. ?  No friction rub. No gallop.  ?   Comments: EKG- ?NSR, 93 bpm ?Moderate LVH  ?No Q waves ?Pulmonary:  ?   Effort: Pulmonary effort is normal.  ?   Breath sounds: No stridor. No wheezing, rhonchi or rales.  ?Abdominal:  ?   General: Abdomen is protuberant. Bowel sounds are normal. There is no distension.  ?   Palpations: Abdomen is soft. There is no hepatomegaly, splenomegaly or mass.  ?   Tenderness: There is no abdominal tenderness. There is no guarding.  ?Musculoskeletal:     ?   General: Normal range of motion.  ?   Cervical back: Neck supple.  ?   Right  lower leg: No edema.  ?   Left lower leg: No edema.  ?Lymphadenopathy:  ?   Cervical: No cervical adenopathy.  ?Skin: ?   General: Skin is warm and dry.  ?   Findings: No rash.  ?Neurological:  ?   General: No focal deficit present.  ?   Mental Status: Christina Bruce is alert. Mental status is at baseline.  ? ? ?Lab Results  ?Component Value Date  ? WBC 9.0 12/13/2021  ? HGB 10.4 (L) 12/13/2021  ? HCT 32.4 (L) 12/13/2021  ? PLT 384.0 12/13/2021  ? GLUCOSE 94 12/13/2021  ? ALT 20 06/17/2021  ? AST 21 06/17/2021  ? NA 137 12/13/2021  ? K 3.7 12/13/2021  ? CL 101 12/13/2021  ? CREATININE 0.72 12/13/2021  ? BUN 8 12/13/2021  ? CO2 28 12/13/2021  ? TSH 1.22 05/24/2021  ?  ? ?Assessment & Plan:  ? ?Christina Bruce was seen today for hypertension and anemia. ? ?Diagnoses and all orders for this visit: ? ?Primary hypertension- Her blood pressure is not adequately well controlled.  I recommended that Christina Bruce start taking amlodipine and spironolactone. ?-     EKG 12-Lead ?-     Basic metabolic panel; Future ?-     Magnesium; Future ?-     Aldosterone + renin activity w/ ratio; Future ?-     CBC with Differential/Platelet; Future ?-     CBC with Differential/Platelet ?-     Aldosterone + renin activity w/ ratio ?-     Magnesium ?-     Basic metabolic panel ?-     amLODipine (NORVASC) 5 MG tablet; Take 1 tablet (5 mg total) by mouth daily. ?-     Discontinue: spironolactone (ALDACTONE) 25 MG tablet; Take 1 tablet (25 mg total) by mouth daily. ? ?Iron deficiency anemia due to chronic blood loss ?-     IBC + Ferritin; Future ?-     CBC with Differential/Platelet; Future ?-     CBC with Differential/Platelet ?-     IBC + Ferritin ?-     Ferric Maltol (ACCRUFER) 30 MG CAPS; Take 1 capsule by mouth in the morning and at bedtime. ? ?LVH (left ventricular hypertrophy) due to hypertensive disease, without heart failure ?-     amLODipine (NORVASC) 5 MG tablet; Take 1 tablet (5 mg total) by mouth daily. ?-     Discontinue: spironolactone (ALDACTONE) 25 MG  tablet; Take 1 tablet (25 mg total) by mouth daily. ? ? ?I have discontinued Christina Bruce's metoprolol succinate, FeroSul, oxyCODONE-acetaminophen, and ondansetron. I am also having her start on ACCRUFeR and amLODipine. Additionally, I am having her maintain her SV Vitamin B-12 ER, pantoprazole, and tamsulosin. ? ?Meds ordered this encounter  ?Medications  ?  Ferric Maltol (ACCRUFER) 30 MG CAPS  ?  Sig: Take 1 capsule by mouth in the morning and at bedtime.  ?  Dispense:  180 capsule  ?  Refill:  1  ? amLODipine (NORVASC) 5 MG tablet  ?  Sig: Take 1 tablet (5 mg total) by mouth daily.  ?  Dispense:  90 tablet  ?  Refill:  0  ? DISCONTD: spironolactone (ALDACTONE) 25 MG tablet  ?  Sig: Take 1 tablet (25 mg total) by mouth daily.  ?  Dispense:  90 tablet  ?  Refill:  0  ? ? ? ?Follow-up: Return in about 3 months (around 03/15/2022). ? ?Scarlette Calico, MD ?

## 2021-12-13 NOTE — Patient Instructions (Signed)

## 2021-12-20 ENCOUNTER — Encounter: Payer: Self-pay | Admitting: Internal Medicine

## 2021-12-21 ENCOUNTER — Ambulatory Visit: Payer: Medicaid Other | Admitting: Internal Medicine

## 2021-12-21 ENCOUNTER — Ambulatory Visit: Payer: Medicaid Other | Admitting: Family Medicine

## 2021-12-21 ENCOUNTER — Encounter: Payer: Self-pay | Admitting: Internal Medicine

## 2021-12-21 VITALS — BP 132/80 | HR 107 | Temp 99.2°F | Ht 61.0 in | Wt 227.0 lb

## 2021-12-21 DIAGNOSIS — R55 Syncope and collapse: Secondary | ICD-10-CM | POA: Diagnosis not present

## 2021-12-21 DIAGNOSIS — I10 Essential (primary) hypertension: Secondary | ICD-10-CM

## 2021-12-21 DIAGNOSIS — H6982 Other specified disorders of Eustachian tube, left ear: Secondary | ICD-10-CM

## 2021-12-21 DIAGNOSIS — J309 Allergic rhinitis, unspecified: Secondary | ICD-10-CM

## 2021-12-21 NOTE — Progress Notes (Signed)
Patient ID: Christina Bruce, female   DOB: 10-30-1980, 41 y.o.   MRN: [id number] ? ? ? ?    Chief Complaint: follow up passing out, and left ear popping ? ?     HPI:  Christina Bruce is a 41 y.o. 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   Pt denies polydipsia, polyuria, or new focal neuro s/s.    Pt denies fever, wt loss, night sweats, loss of appetite, or other constitutional symptoms   Also incidentally with left ear popping and crackling after onset nasal allergy symptoms and congestion in the past several wks as well. Does have several wks ongoing nasal allergy symptoms with clearish congestion, itch and sneezing.  Pt currently taking aldactone and norvasc for BP.   Has somewhat low grade temp today - Denies urinary symptoms such as dysuria, frequency, urgency, flank pain, hematuria or n/v, fever, chills.  No cough. ?Wt Readings from Last 3 Encounters:  ?12/21/21 227 lb (103 kg)  ?12/13/21 229 lb (103.9 kg)  ?10/19/21 223 lb 1.7 oz (101.2 kg)  ? ?BP Readings from Last 3 Encounters:  ?12/21/21 132/80  ?12/13/21 (!) 144/96  ?10/19/21 (!) 151/98  ? ?      ?Past Medical History:  ?Diagnosis Date  ? Anemia   ? Headache(784.0)   ? Rh negative, maternal   ? ?Past Surgical History:  ?Procedure Laterality Date  ? CESAREAN SECTION    ? TUBAL LIGATION Bilateral   ? ? reports that she has never smoked. She has never been exposed to tobacco smoke. She has never used smokeless tobacco. She reports that she does not drink alcohol and does not use drugs. ?family history includes Diabetes in her mother; Hypertension in her father and mother; Thyroid disease in her sister. ?Allergies  ?Allergen Reactions  ? Metoprolol Other  (See Comments)  ?  Drowsiness ?  ? Penicillins Hives  ? ?Current Outpatient Medications on File Prior to Visit  ?Medication Sig Dispense Refill  ? amLODipine (NORVASC) 5 MG tablet Take 1 tablet (5 mg total) by mouth daily. 90 tablet 0  ? Ferric Maltol (ACCRUFER) 30 MG CAPS Take 1 capsule by mouth in the morning and at bedtime. 180 capsule 1  ? pantoprazole (PROTONIX) 20 MG tablet Take 20 mg by mouth 2 (two) times daily.    ? SV VITAMIN B-12 ER 1000 MCG TBCR Take 1 tablet by mouth daily.    ? tamsulosin (FLOMAX) 0.4 MG CAPS capsule Take 1 capsule (0.4 mg total) by mouth daily. 10 capsule 0  ? ?No current facility-administered medications on file prior to visit.  ? ?     ROS:  All others reviewed and negative. ? ?Objective  ? ?     PE:  BP 132/80 (BP Location: Right Arm, Patient Position: Sitting, Cuff Size: Large)   Pulse (!) 107   Temp 99.2 ?F (37.3 ?C) (Oral)   Ht 5\' 1"  (1.549 m)   Wt 227 lb (103 kg)   SpO2 98%   BMI 42.89 kg/m?  ? ?              Constitutional: Pt appears in NAD ?  HENT: Head: NCAT.  ?              Right Ear: External ear normal.   ?              Left Ear: External ear normal.  ?              Eyes: . Pupils are equal, round, and reactive to light. Conjunctivae and EOM are normal ?              Nose: without d/c or deformity ?              Neck: Neck supple. Gross normal ROM ?              Cardiovascular: Normal rate and regular rhythm.   ?              Pulmonary/Chest: Effort normal and breath sounds without rales or wheezing.  ?              Abd:  Soft, NT, ND, + BS, no organomegaly ?              Neurological: Pt is alert. At baseline orientation, motor grossly intact ?              Skin: Skin is warm. No rashes, no other new lesions, LE edema - none ?              Psychiatric: Pt behavior is normal without agitation  ? ?Micro: none ? ?Cardiac tracings I have personally interpreted today:  none ? ?Pertinent Radiological findings (summarize): none  ? ?Lab Results  ?Component  Value Date  ? WBC 9.0 12/13/2021  ? HGB 10.4 (L) 12/13/2021  ? HCT 32.4 (L) 12/13/2021  ? PLT 384.0 12/13/2021  ? GLUCOSE 94 12/13/2021  ? ALT 20 06/17/2021  ? AST 21 06/17/2021  ? NA 137 12/13/2021  ? K 3.7 12/13/2021  ? CL 101 12/13/2021  ? CREATININE 0.72 12/13/2021  ? BUN 8 12/13/2021  ? CO2 28 12/13/2021  ? TSH 1.22 05/24/2021  ? ?Assessment/Plan:  ?Christina Bruce is a 41 y.o. Black or African American [2] female with  has a past medical history of Anemia, Headache(784.0), and Rh negative, maternal. ? ?Syncope ?With mild regular tachycardia today; declines ecg; I suspect mild low volume with working in warm environ and taking diuretic; to d/c aldactone, encouraged fluids, for work note, also for echo and f/u Dr Ronnald Ramp in 6 wks ? ?Primary hypertension ?Ok to d/c aldactone as above, continue amlodipine, and f/u BP at home and next visit ?BP Readings from Last 3 Encounters:  ?12/21/21 132/80  ?12/13/21 (!) 144/96  ?10/19/21 (!) 151/98  ? ? ?Acute dysfunction of Eustachian tube, left ?Mild, not likely related to syncope, for mucinex bid prn ? ?Allergic rhinitis ?Also for otc anthihistamine and nasacort asd,  to f/u any worsening symptoms or concerns ? ?Followup: Return if symptoms worsen or fail to improve. ? ?Cathlean Cower, MD 12/23/2021 1:15 PM ?[place] ?[place] ?Internal Medicine ?

## 2021-12-21 NOTE — Patient Instructions (Signed)
Ok for off work note for today and tomorrow ? ?Please drink more fluids ? ?Ok to stop the spironolactone ? ?Please continue all other medications as before, and refills have been done if requested. ? ?Please have the pharmacy call with any other refills you may need. ? ?Please continue your efforts at being more active, low cholesterol diet, and weight control. ? ?Please keep your appointments with your specialists as you may have planned ? ?You will be contacted regarding the referral for: Echocardiogram ? ?Please see Dr Yetta Barre in 6 weeks ? ? ? ?

## 2021-12-22 LAB — ALDOSTERONE + RENIN ACTIVITY W/ RATIO
ALDO / PRA Ratio: 4 Ratio (ref 0.9–28.9)
Aldosterone: 5 ng/dL
Renin Activity: 1.26 ng/mL/h (ref 0.25–5.82)

## 2021-12-23 ENCOUNTER — Encounter: Payer: Self-pay | Admitting: Internal Medicine

## 2021-12-23 DIAGNOSIS — H6982 Other specified disorders of Eustachian tube, left ear: Secondary | ICD-10-CM | POA: Insufficient documentation

## 2021-12-23 DIAGNOSIS — J309 Allergic rhinitis, unspecified: Secondary | ICD-10-CM | POA: Insufficient documentation

## 2021-12-23 NOTE — Assessment & Plan Note (Signed)
Also for otc anthihistamine and nasacort asd,  to f/u any worsening symptoms or concerns ?

## 2021-12-23 NOTE — Assessment & Plan Note (Signed)
Mild, not likely related to syncope, for mucinex bid prn ?

## 2021-12-23 NOTE — Assessment & Plan Note (Signed)
Ok to d/c aldactone as above, continue amlodipine, and f/u BP at home and next visit ?BP Readings from Last 3 Encounters:  ?12/21/21 132/80  ?12/13/21 (!) 144/96  ?10/19/21 (!) 151/98  ? ?

## 2021-12-23 NOTE — Assessment & Plan Note (Signed)
With mild regular tachycardia today; declines ecg; I suspect mild low volume with working in warm environ and taking diuretic; to d/c aldactone, encouraged fluids, for work note, also for echo and f/u Dr Ronnald Ramp in 6 wks ?

## 2022-01-10 ENCOUNTER — Encounter: Payer: Self-pay | Admitting: Internal Medicine

## 2022-02-01 ENCOUNTER — Telehealth (HOSPITAL_COMMUNITY): Payer: Self-pay | Admitting: Internal Medicine

## 2022-02-01 ENCOUNTER — Ambulatory Visit: Payer: Medicaid Other | Admitting: Internal Medicine

## 2022-02-01 NOTE — Telephone Encounter (Signed)
Just an FYI. We have made several attempts to contact the office  for a Prior Authorization for the ordered echocardiogram. We will be removing the patient from the echo WQ.   01/26/22 Inbasket x 6 sent for PA#  01/15/22 Inbasket x 5 sent for PA and copied MD 01/08/22 Inbaskset x 4 sent for PA#  01/04/22 Inbasket x 2 sent for PA# 12/28/21 Inbasket x 2 sent for PA#  12/21/21 inbasket for PA     Thank you

## 2022-03-02 ENCOUNTER — Telehealth: Payer: Self-pay | Admitting: Internal Medicine

## 2022-03-02 NOTE — Telephone Encounter (Signed)
Christina Bruce from [facility] [employer] called and states an authorization is needed for pt to have echocardiogram completed on Tuesday 6/20.  States authorization must be done 48 business hours in advance.   Please send in ASAP.

## 2022-03-05 ENCOUNTER — Encounter (HOSPITAL_COMMUNITY): Payer: Self-pay

## 2022-03-06 ENCOUNTER — Ambulatory Visit (HOSPITAL_COMMUNITY): Payer: Medicaid Other

## 2022-03-15 ENCOUNTER — Encounter: Payer: Self-pay | Admitting: Internal Medicine

## 2022-03-15 ENCOUNTER — Telehealth: Payer: Self-pay | Admitting: *Deleted

## 2022-03-15 ENCOUNTER — Ambulatory Visit: Payer: Medicaid Other | Admitting: Internal Medicine

## 2022-03-15 VITALS — BP 144/92 | HR 81 | Temp 98.3°F | Resp 16 | Ht 61.0 in | Wt 231.0 lb

## 2022-03-15 DIAGNOSIS — I119 Hypertensive heart disease without heart failure: Secondary | ICD-10-CM

## 2022-03-15 DIAGNOSIS — D500 Iron deficiency anemia secondary to blood loss (chronic): Secondary | ICD-10-CM | POA: Diagnosis not present

## 2022-03-15 DIAGNOSIS — I10 Essential (primary) hypertension: Secondary | ICD-10-CM

## 2022-03-15 DIAGNOSIS — E538 Deficiency of other specified B group vitamins: Secondary | ICD-10-CM | POA: Diagnosis not present

## 2022-03-15 DIAGNOSIS — N921 Excessive and frequent menstruation with irregular cycle: Secondary | ICD-10-CM | POA: Diagnosis not present

## 2022-03-15 DIAGNOSIS — D520 Dietary folate deficiency anemia: Secondary | ICD-10-CM | POA: Diagnosis not present

## 2022-03-15 LAB — VITAMIN B12: Vitamin B-12: 311 pg/mL (ref 211–911)

## 2022-03-15 LAB — IBC + FERRITIN
Ferritin: 7 ng/mL — ABNORMAL LOW (ref 10.0–291.0)
Iron: 26 ug/dL — ABNORMAL LOW (ref 42–145)
Saturation Ratios: 4.9 % — ABNORMAL LOW (ref 20.0–50.0)
TIBC: 533.4 ug/dL — ABNORMAL HIGH (ref 250.0–450.0)
Transferrin: 381 mg/dL — ABNORMAL HIGH (ref 212.0–360.0)

## 2022-03-15 LAB — CBC WITH DIFFERENTIAL/PLATELET
Basophils Absolute: 0.1 10*3/uL (ref 0.0–0.1)
Basophils Relative: 0.6 % (ref 0.0–3.0)
Eosinophils Absolute: 0.1 10*3/uL (ref 0.0–0.7)
Eosinophils Relative: 1 % (ref 0.0–5.0)
HCT: 33.3 % — ABNORMAL LOW (ref 36.0–46.0)
Hemoglobin: 10.4 g/dL — ABNORMAL LOW (ref 12.0–15.0)
Lymphocytes Relative: 28.8 % (ref 12.0–46.0)
Lymphs Abs: 2.3 10*3/uL (ref 0.7–4.0)
MCHC: 31.3 g/dL (ref 30.0–36.0)
MCV: 66.9 fl — ABNORMAL LOW (ref 78.0–100.0)
Monocytes Absolute: 0.7 10*3/uL (ref 0.1–1.0)
Monocytes Relative: 8.6 % (ref 3.0–12.0)
Neutro Abs: 4.8 10*3/uL (ref 1.4–7.7)
Neutrophils Relative %: 61 % (ref 43.0–77.0)
Platelets: 372 10*3/uL (ref 150.0–400.0)
RBC: 4.99 Mil/uL (ref 3.87–5.11)
RDW: 19.1 % — ABNORMAL HIGH (ref 11.5–15.5)
WBC: 7.9 10*3/uL (ref 4.0–10.5)

## 2022-03-15 LAB — HCG, QUANTITATIVE, PREGNANCY: Quantitative HCG: <0.6 m[IU]/mL

## 2022-03-15 LAB — FOLATE: Folate: 5.5 ng/mL — ABNORMAL LOW (ref >5.9)

## 2022-03-15 MED ORDER — SEMAGLUTIDE-WEIGHT MANAGEMENT 0.5 MG/0.5ML ~~LOC~~ SOAJ: 0.5000 mg | SUBCUTANEOUS | 0 refills | Status: AC

## 2022-03-15 MED ORDER — SEMAGLUTIDE-WEIGHT MANAGEMENT 0.25 MG/0.5ML ~~LOC~~ SOAJ: 0.2500 mg | SUBCUTANEOUS | 0 refills | Status: AC

## 2022-03-15 MED ORDER — FOLIC ACID 1 MG PO TABS: 1.0000 mg | ORAL_TABLET | Freq: Every day | ORAL | 1 refills | Status: DC

## 2022-03-15 MED ORDER — SEMAGLUTIDE-WEIGHT MANAGEMENT 2.4 MG/0.75ML ~~LOC~~ SOAJ: 2.4000 mg | SUBCUTANEOUS | 0 refills | Status: DC

## 2022-03-15 MED ORDER — SEMAGLUTIDE-WEIGHT MANAGEMENT 1 MG/0.5ML ~~LOC~~ SOAJ: 1.0000 mg | SUBCUTANEOUS | 0 refills | Status: AC

## 2022-03-15 MED ORDER — SEMAGLUTIDE-WEIGHT MANAGEMENT 1.7 MG/0.75ML ~~LOC~~ SOAJ: 1.7000 mg | SUBCUTANEOUS | 0 refills | Status: DC

## 2022-03-15 NOTE — Telephone Encounter (Signed)
Pt was on cover-my-meds need PA on [employer]. Submitted PA w/ (Key: [id number]). PA sent [employer] [facility] [place].Marland KitchenRaechel Chute

## 2022-03-15 NOTE — Patient Instructions (Signed)
Hypertension, Adult High blood pressure (hypertension) is when the force of blood pumping through the arteries is too strong. The arteries are the blood vessels that carry blood from the heart throughout the body. Hypertension forces the heart to work harder to pump blood and may cause arteries to become narrow or stiff. Untreated or uncontrolled hypertension can lead to a heart attack, heart failure, a stroke, kidney disease, and other problems. A blood pressure reading consists of a higher number over a lower number. Ideally, your blood pressure should be below 120/80. The first ("top") number is called the systolic pressure. It is a measure of the pressure in your arteries as your heart beats. The second ("bottom") number is called the diastolic pressure. It is a measure of the pressure in your arteries as the heart relaxes. What are the causes? The exact cause of this condition is not known. There are some conditions that result in high blood pressure. What increases the risk? Certain factors may make you more likely to develop high blood pressure. Some of these risk factors are under your control, including: Smoking. Not getting enough exercise or physical activity. Being overweight. Having too much fat, sugar, calories, or salt (sodium) in your diet. Drinking too much alcohol. Other risk factors include: Having a personal history of heart disease, diabetes, high cholesterol, or kidney disease. Stress. Having a family history of high blood pressure and high cholesterol. Having obstructive sleep apnea. Age. The risk increases with age. What are the signs or symptoms? High blood pressure may not cause symptoms. Very high blood pressure (hypertensive crisis) may cause: Headache. Fast or irregular heartbeats (palpitations). Shortness of breath. Nosebleed. Nausea and vomiting. Vision changes. Severe chest pain, dizziness, and seizures. How is this diagnosed? This condition is diagnosed by  measuring your blood pressure while you are seated, with your arm resting on a flat surface, your legs uncrossed, and your feet flat on the floor. The cuff of the blood pressure monitor will be placed directly against the skin of your upper arm at the level of your heart. Blood pressure should be measured at least twice using the same arm. Certain conditions can cause a difference in blood pressure between your right and left arms. If you have a high blood pressure reading during one visit or you have normal blood pressure with other risk factors, you may be asked to: Return on a different day to have your blood pressure checked again. Monitor your blood pressure at home for 1 week or longer. If you are diagnosed with hypertension, you may have other blood or imaging tests to help your health care provider understand your overall risk for other conditions. How is this treated? This condition is treated by making healthy lifestyle changes, such as eating healthy foods, exercising more, and reducing your alcohol intake. You may be referred for counseling on a healthy diet and physical activity. Your health care provider may prescribe medicine if lifestyle changes are not enough to get your blood pressure under control and if: Your systolic blood pressure is above 130. Your diastolic blood pressure is above 80. Your personal target blood pressure may vary depending on your medical conditions, your age, and other factors. Follow these instructions at home: Eating and drinking  Eat a diet that is high in fiber and potassium, and low in sodium, added sugar, and fat. An example of this eating plan is called the DASH diet. DASH stands for Dietary Approaches to Stop Hypertension. To eat this way: Eat   plenty of fresh fruits and vegetables. Try to fill one half of your plate at each meal with fruits and vegetables. Eat whole grains, such as whole-wheat pasta, brown rice, or whole-grain bread. Fill about one  fourth of your plate with whole grains. Eat or drink low-fat dairy products, such as skim milk or low-fat yogurt. Avoid fatty cuts of meat, processed or cured meats, and poultry with skin. Fill about one fourth of your plate with lean proteins, such as fish, chicken without skin, beans, eggs, or tofu. Avoid pre-made and processed foods. These tend to be higher in sodium, added sugar, and fat. Reduce your daily sodium intake. Many people with hypertension should eat less than 1,500 mg of sodium a day. Do not drink alcohol if: Your health care provider tells you not to drink. You are pregnant, may be pregnant, or are planning to become pregnant. If you drink alcohol: Limit how much you have to: 0-1 drink a day for women. 0-2 drinks a day for men. Know how much alcohol is in your drink. In the [place] one drink equals one 12 oz bottle of beer (355 mL), one 5 oz glass of wine (148 mL), or one 1 oz glass of hard liquor (44 mL). Lifestyle  Work with your health care provider to maintain a healthy body weight or to lose weight. Ask what an ideal weight is for you. Get at least 30 minutes of exercise that causes your heart to beat faster (aerobic exercise) most days of the week. Activities may include walking, swimming, or biking. Include exercise to strengthen your muscles (resistance exercise), such as Pilates or lifting weights, as part of your weekly exercise routine. Try to do these types of exercises for 30 minutes at least 3 days a week. Do not use any products that contain nicotine or tobacco. These products include cigarettes, chewing tobacco, and vaping devices, such as e-cigarettes. If you need help quitting, ask your health care provider. Monitor your blood pressure at home as told by your health care provider. Keep all follow-up visits. This is important. Medicines Take over-the-counter and prescription medicines only as told by your health care provider. Follow directions carefully. Blood  pressure medicines must be taken as prescribed. Do not skip doses of blood pressure medicine. Doing this puts you at risk for problems and can make the medicine less effective. Ask your health care provider about side effects or reactions to medicines that you should watch for. Contact a health care provider if you: Think you are having a reaction to a medicine you are taking. Have headaches that keep coming back (recurring). Feel dizzy. Have swelling in your ankles. Have trouble with your vision. Get help right away if you: Develop a severe headache or confusion. Have unusual weakness or numbness. Feel faint. Have severe pain in your chest or abdomen. Vomit repeatedly. Have trouble breathing. These symptoms may be an emergency. Get help right away. Call 911. Do not wait to see if the symptoms will go away. Do not drive yourself to the hospital. Summary Hypertension is when the force of blood pumping through your arteries is too strong. If this condition is not controlled, it may put you at risk for serious complications. Your personal target blood pressure may vary depending on your medical conditions, your age, and other factors. For most people, a normal blood pressure is less than 120/80. Hypertension is treated with lifestyle changes, medicines, or a combination of both. Lifestyle changes include losing weight, eating a healthy,   low-sodium diet, exercising more, and limiting alcohol. This information is not intended to replace advice given to you by your health care provider. Make sure you discuss any questions you have with your health care provider. Document Revised: 07/11/2021 Document Reviewed: 07/11/2021 Elsevier Patient Education  2023 [employer]  

## 2022-03-15 NOTE — Progress Notes (Unsigned)
Subjective:  Patient ID: Christina Bruce, female    DOB: 14-May-1981  Age: 41 y.o. MRN: [id number]  CC: Hypertension and Anemia   HPI [name] presents for f/up -she complains of dizziness, lightheadedness, and nausea.  She does not take her blood pressure is well controlled but she is taking amlodipine.  She has gained 4 pounds in the last 2 months.  Outpatient Medications Prior to Visit  Medication Sig Dispense Refill   amLODipine (NORVASC) 5 MG tablet Take 1 tablet (5 mg total) by mouth daily. 90 tablet 0   Ferric Maltol (ACCRUFER) 30 MG CAPS Take 1 capsule by mouth in the morning and at bedtime. 180 capsule 1   pantoprazole (PROTONIX) 20 MG tablet Take 20 mg by mouth 2 (two) times daily.     SV VITAMIN B-12 ER 1000 MCG TBCR Take 1 tablet by mouth daily.     tamsulosin (FLOMAX) 0.4 MG CAPS capsule Take 1 capsule (0.4 mg total) by mouth daily. 10 capsule 0   No facility-administered medications prior to visit.    ROS Review of Systems  Constitutional:  Positive for unexpected weight change (wt gain). Negative for chills, diaphoresis and fatigue.  HENT: Negative.    Eyes: Negative.   Cardiovascular:  Negative for chest pain, palpitations and leg swelling.  Gastrointestinal:  Positive for nausea. Negative for abdominal pain, constipation, diarrhea and vomiting.  Endocrine: Negative.   Genitourinary:  Positive for menstrual problem (long, heavy periods). Negative for dysuria and hematuria.  Musculoskeletal: Negative.  Negative for arthralgias and myalgias.  Skin: Negative.   Neurological:  Positive for dizziness and light-headedness. Negative for weakness, numbness and headaches.  Hematological:  Negative for adenopathy. Does not bruise/bleed easily.  Psychiatric/Behavioral: Negative.      Objective:  BP (!) 144/92 (BP Location: Right Arm, Patient Position: Sitting, Cuff Size: Large)   Pulse 81   Temp 98.3 F (36.8 C) (Oral)   Resp 16   Ht 5\' 1"  (1.549 m)   Wt 231 lb  (104.8 kg)   LMP 02/05/2022 (Exact Date) Comment: BTL  SpO2 98%   Breastfeeding No   BMI 43.65 kg/m   BP Readings from Last 3 Encounters:  03/15/22 (!) 144/92  12/21/21 132/80  12/13/21 (!) 144/96    Wt Readings from Last 3 Encounters:  03/15/22 231 lb (104.8 kg)  12/21/21 227 lb (103 kg)  12/13/21 229 lb (103.9 kg)    Physical Exam Vitals reviewed.  Constitutional:      Appearance: She is obese.  HENT:     Nose: Nose normal.     Mouth/Throat:     Mouth: Mucous membranes are moist.  Eyes:     General: No scleral icterus.    Conjunctiva/sclera: Conjunctivae normal.  Cardiovascular:     Rate and Rhythm: Normal rate and regular rhythm.     Heart sounds: No murmur heard. Pulmonary:     Effort: Pulmonary effort is normal.     Breath sounds: No stridor. No wheezing, rhonchi or rales.  Abdominal:     General: Abdomen is protuberant. Bowel sounds are normal. There is no distension.     Palpations: Abdomen is soft. There is no hepatomegaly, splenomegaly or mass.     Tenderness: There is no abdominal tenderness. There is no guarding.  Musculoskeletal:        General: Normal range of motion.     Cervical back: Neck supple.     Right lower leg: No edema.  Left lower leg: No edema.  Lymphadenopathy:     Cervical: No cervical adenopathy.  Skin:    General: Skin is warm and dry.  Neurological:     General: No focal deficit present.     Mental Status: She is alert.  Psychiatric:        Mood and Affect: Mood normal.        Behavior: Behavior normal.     Lab Results  Component Value Date   WBC 7.9 03/15/2022   HGB 10.4 (L) 03/15/2022   HCT 33.3 (L) 03/15/2022   PLT 372.0 03/15/2022   GLUCOSE 94 12/13/2021   ALT 20 06/17/2021   AST 21 06/17/2021   NA 137 12/13/2021   K 3.7 12/13/2021   CL 101 12/13/2021   CREATININE 0.72 12/13/2021   BUN 8 12/13/2021   CO2 28 12/13/2021   TSH 1.22 05/24/2021    No results found.  Assessment & Plan:   Paisely was seen  today for hypertension and anemia.  Diagnoses and all orders for this visit:  Primary hypertension- Her blood pressure is not adequately well controlled.  Will add indapamide. -     hCG, quantitative, pregnancy; Future -     hCG, quantitative, pregnancy -     indapamide (LOZOL) 1.25 MG tablet; Take 1 tablet (1.25 mg total) by mouth daily.  LVH (left ventricular hypertrophy) due to hypertensive disease, without heart failure -     hCG, quantitative, pregnancy; Future -     hCG, quantitative, pregnancy -     indapamide (LOZOL) 1.25 MG tablet; Take 1 tablet (1.25 mg total) by mouth daily.  Low vitamin B12 level- Her B12 level is normal but she has folate deficiency. -     Vitamin B12; Future -     CBC with Differential/Platelet; Future -     Folate; Future -     hCG, quantitative, pregnancy; Future -     hCG, quantitative, pregnancy -     Folate -     CBC with Differential/Platelet -     Vitamin B12  Iron deficiency anemia due to chronic blood loss- She continues to be anemic and her iron level is low.  I have asked her to see GYN to have the heavy cycles treated. -     IBC + Ferritin; Future -     CBC with Differential/Platelet; Future -     hCG, quantitative, pregnancy; Future -     hCG, quantitative, pregnancy -     CBC with Differential/Platelet -     IBC + Ferritin  Obesity, morbid, BMI 40.0-49.9 (HCC) -     Semaglutide-Weight Management 0.25 MG/0.5ML SOAJ; Inject 0.25 mg into the skin once a week for 28 days. -     Semaglutide-Weight Management 0.5 MG/0.5ML SOAJ; Inject 0.5 mg into the skin once a week for 28 days. -     Semaglutide-Weight Management 1 MG/0.5ML SOAJ; Inject 1 mg into the skin once a week for 28 days. -     Semaglutide-Weight Management 1.7 MG/0.75ML SOAJ; Inject 1.7 mg into the skin once a week for 28 days. -     Semaglutide-Weight Management 2.4 MG/0.75ML SOAJ; Inject 2.4 mg into the skin once a week for 28 days.  Dietary folate deficiency anemia -      folic acid (FOLVITE) 1 MG tablet; Take 1 tablet (1 mg total) by mouth daily.  Menometrorrhagia -     Ambulatory referral to Gynecology   I have discontinued  Kamala L. Leon's tamsulosin. I am also having her start on folic acid, Semaglutide-Weight Management, Semaglutide-Weight Management, Semaglutide-Weight Management, Semaglutide-Weight Management, Semaglutide-Weight Management, and indapamide. Additionally, I am having her maintain her SV Vitamin B-12 ER, pantoprazole, ACCRUFeR, and amLODipine.  Meds ordered this encounter  Medications   folic acid (FOLVITE) 1 MG tablet    Sig: Take 1 tablet (1 mg total) by mouth daily.    Dispense:  90 tablet    Refill:  1   Semaglutide-Weight Management 0.25 MG/0.5ML SOAJ    Sig: Inject 0.25 mg into the skin once a week for 28 days.    Dispense:  2 mL    Refill:  0   Semaglutide-Weight Management 0.5 MG/0.5ML SOAJ    Sig: Inject 0.5 mg into the skin once a week for 28 days.    Dispense:  2 mL    Refill:  0   Semaglutide-Weight Management 1 MG/0.5ML SOAJ    Sig: Inject 1 mg into the skin once a week for 28 days.    Dispense:  2 mL    Refill:  0   Semaglutide-Weight Management 1.7 MG/0.75ML SOAJ    Sig: Inject 1.7 mg into the skin once a week for 28 days.    Dispense:  3 mL    Refill:  0   Semaglutide-Weight Management 2.4 MG/0.75ML SOAJ    Sig: Inject 2.4 mg into the skin once a week for 28 days.    Dispense:  3 mL    Refill:  0   indapamide (LOZOL) 1.25 MG tablet    Sig: Take 1 tablet (1.25 mg total) by mouth daily.    Dispense:  90 tablet    Refill:  0     Follow-up: Return in about 3 months (around 06/15/2022).  Scarlette Calico, MD

## 2022-03-16 MED ORDER — INDAPAMIDE 1.25 MG PO TABS: 1.2500 mg | ORAL_TABLET | Freq: Every day | ORAL | 0 refills | Status: DC

## 2022-03-16 NOTE — Telephone Encounter (Signed)
Rec'd determination fax med has been DENIED. It states This request has received a Unfavorable outcome. Pt insurance plan does not cover med. No alternative was given../lm,b

## 2022-03-19 ENCOUNTER — Other Ambulatory Visit: Payer: Self-pay | Admitting: Internal Medicine

## 2022-03-22 ENCOUNTER — Telehealth: Payer: Self-pay | Admitting: Internal Medicine

## 2022-03-22 NOTE — Telephone Encounter (Signed)
Pt would like to know if she is supposed to take amLODipine (NORVASC) 5 MG tablet and indapamide (LOZOL) 1.25 MG tablet . She is unsure if she is supposed to discontinue amplodipine since she's been prescribed indapamide.  Please advise.  CB: [phone]

## 2022-03-22 NOTE — Telephone Encounter (Signed)
Pt has been informed to take both of the medications, amlodipine and indapamide, she expressed understanding.

## 2022-04-24 ENCOUNTER — Other Ambulatory Visit: Payer: Self-pay | Admitting: Internal Medicine

## 2022-04-24 DIAGNOSIS — I119 Hypertensive heart disease without heart failure: Secondary | ICD-10-CM

## 2022-04-24 DIAGNOSIS — I10 Essential (primary) hypertension: Secondary | ICD-10-CM

## 2022-04-24 MED ORDER — AMLODIPINE BESYLATE 5 MG PO TABS: 5.0000 mg | ORAL_TABLET | Freq: Every day | ORAL | 0 refills | Status: DC

## 2022-05-09 IMAGING — CR DG ELBOW COMPLETE 3+V*R*
4 series · 4 of 4 positions shown · non-contrast
Comparison: None.

CLINICAL DATA: Fall

EXAM:
RIGHT ELBOW - COMPLETE 3+ VIEW

[x elbow joint ap right]
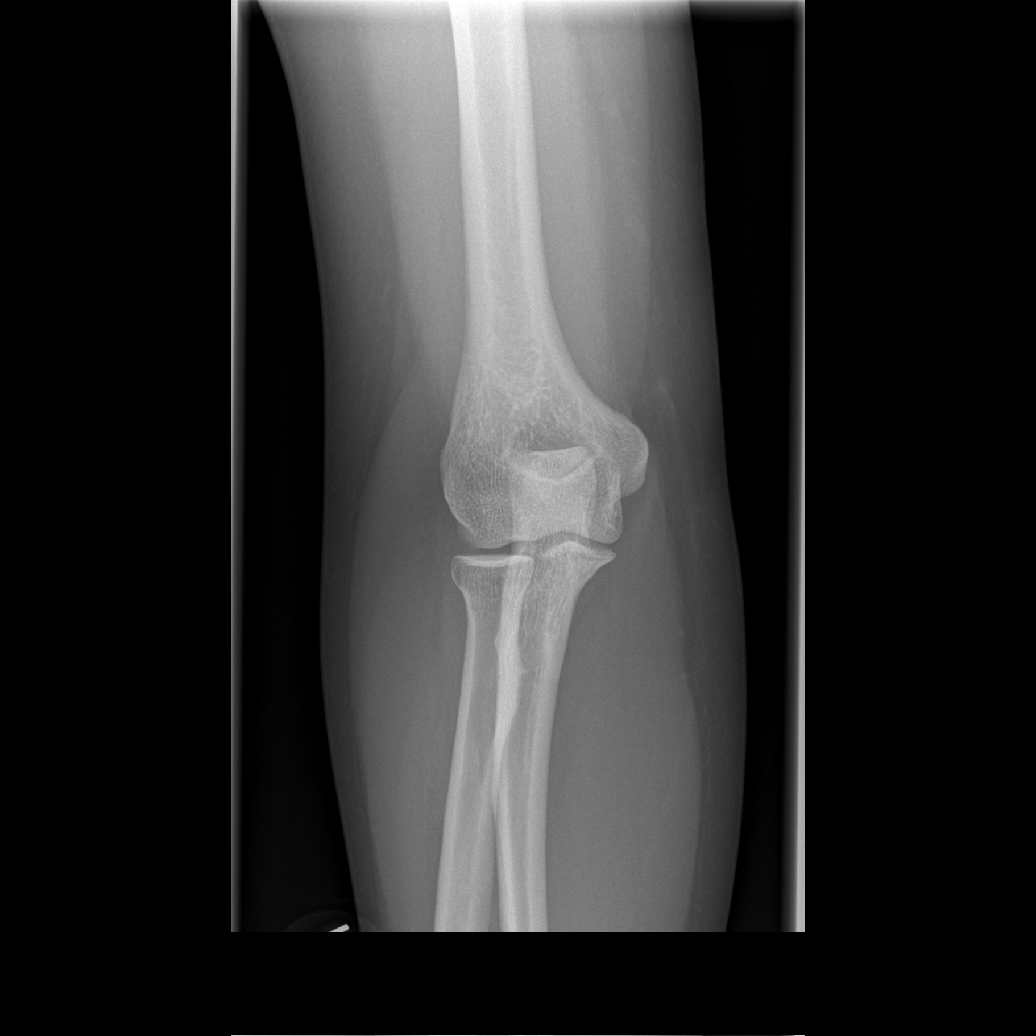

[x elbow joint obl. right (1 of 2)]
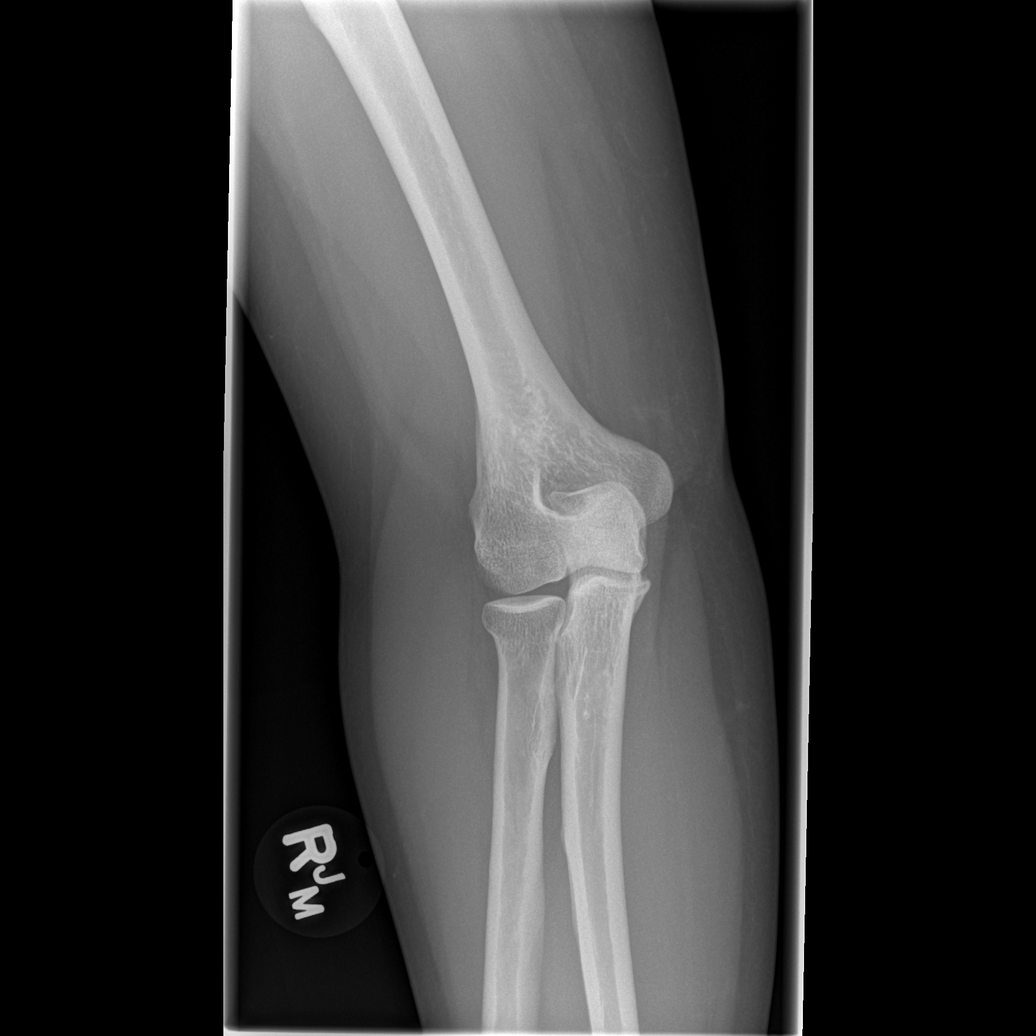

[x elbow joint obl. right (2 of 2)]
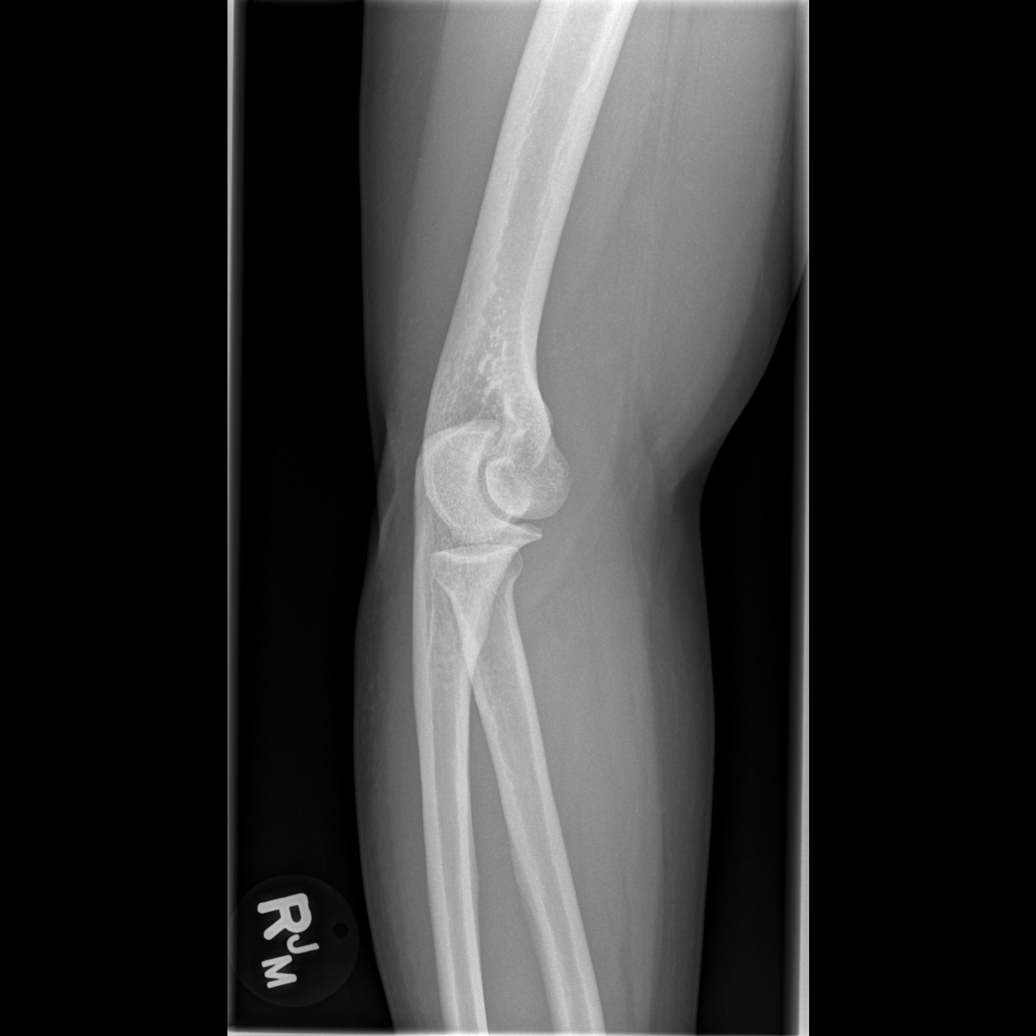

[x elbow joint lat right]
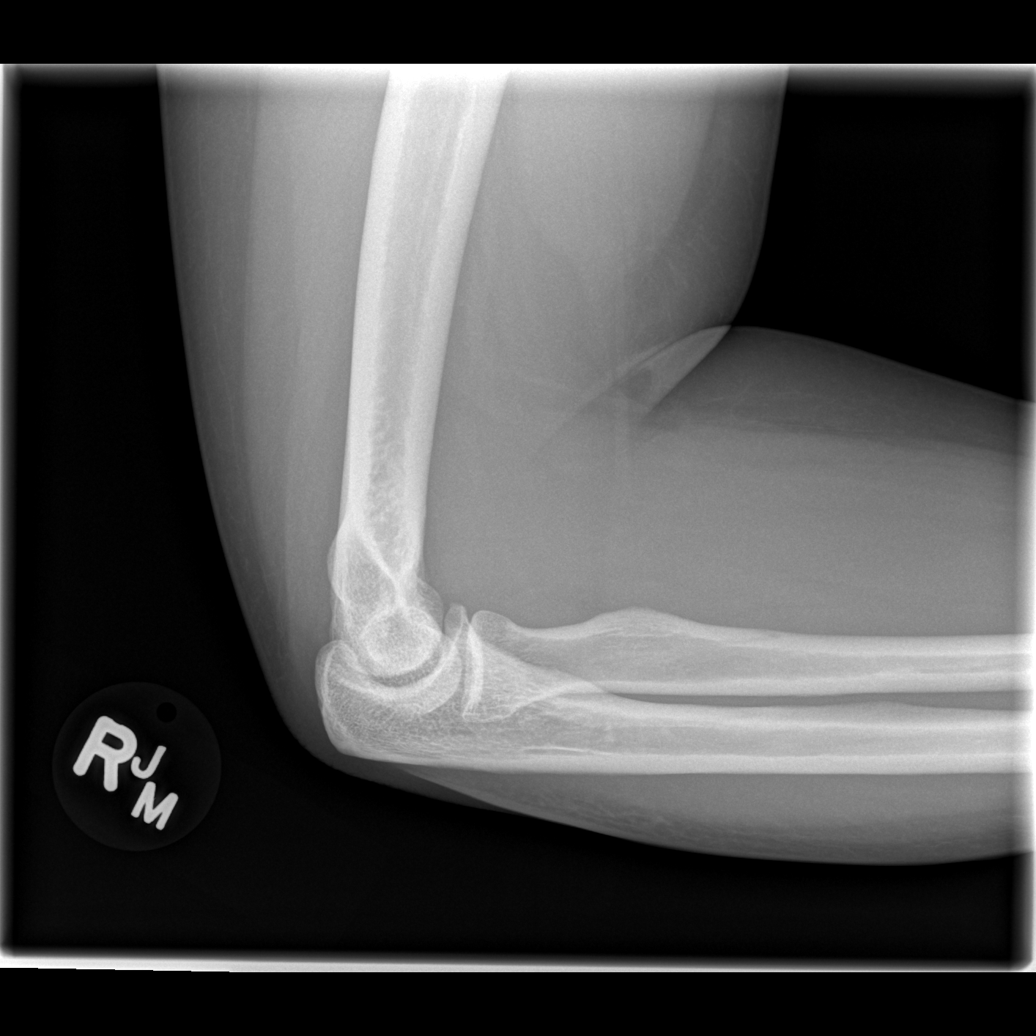

[4 of 4 positions shown; findings below may reference images not displayed]

FINDINGS: There is no evidence of fracture, dislocation, or joint effusion.
There is no evidence of arthropathy or other focal bone abnormality.
Soft tissues are unremarkable.
IMPRESSION: Negative.

## 2022-05-09 IMAGING — DX DG SHOULDER 2+V*R*
3 series · 3 of 3 positions shown · non-contrast
Comparison: None.

CLINICAL DATA: Right shoulder pain and limited range of motion
since a fall yesterday. Initial encounter.

EXAM:
RIGHT SHOULDER - 2+ VIEW

[shoulder y view]
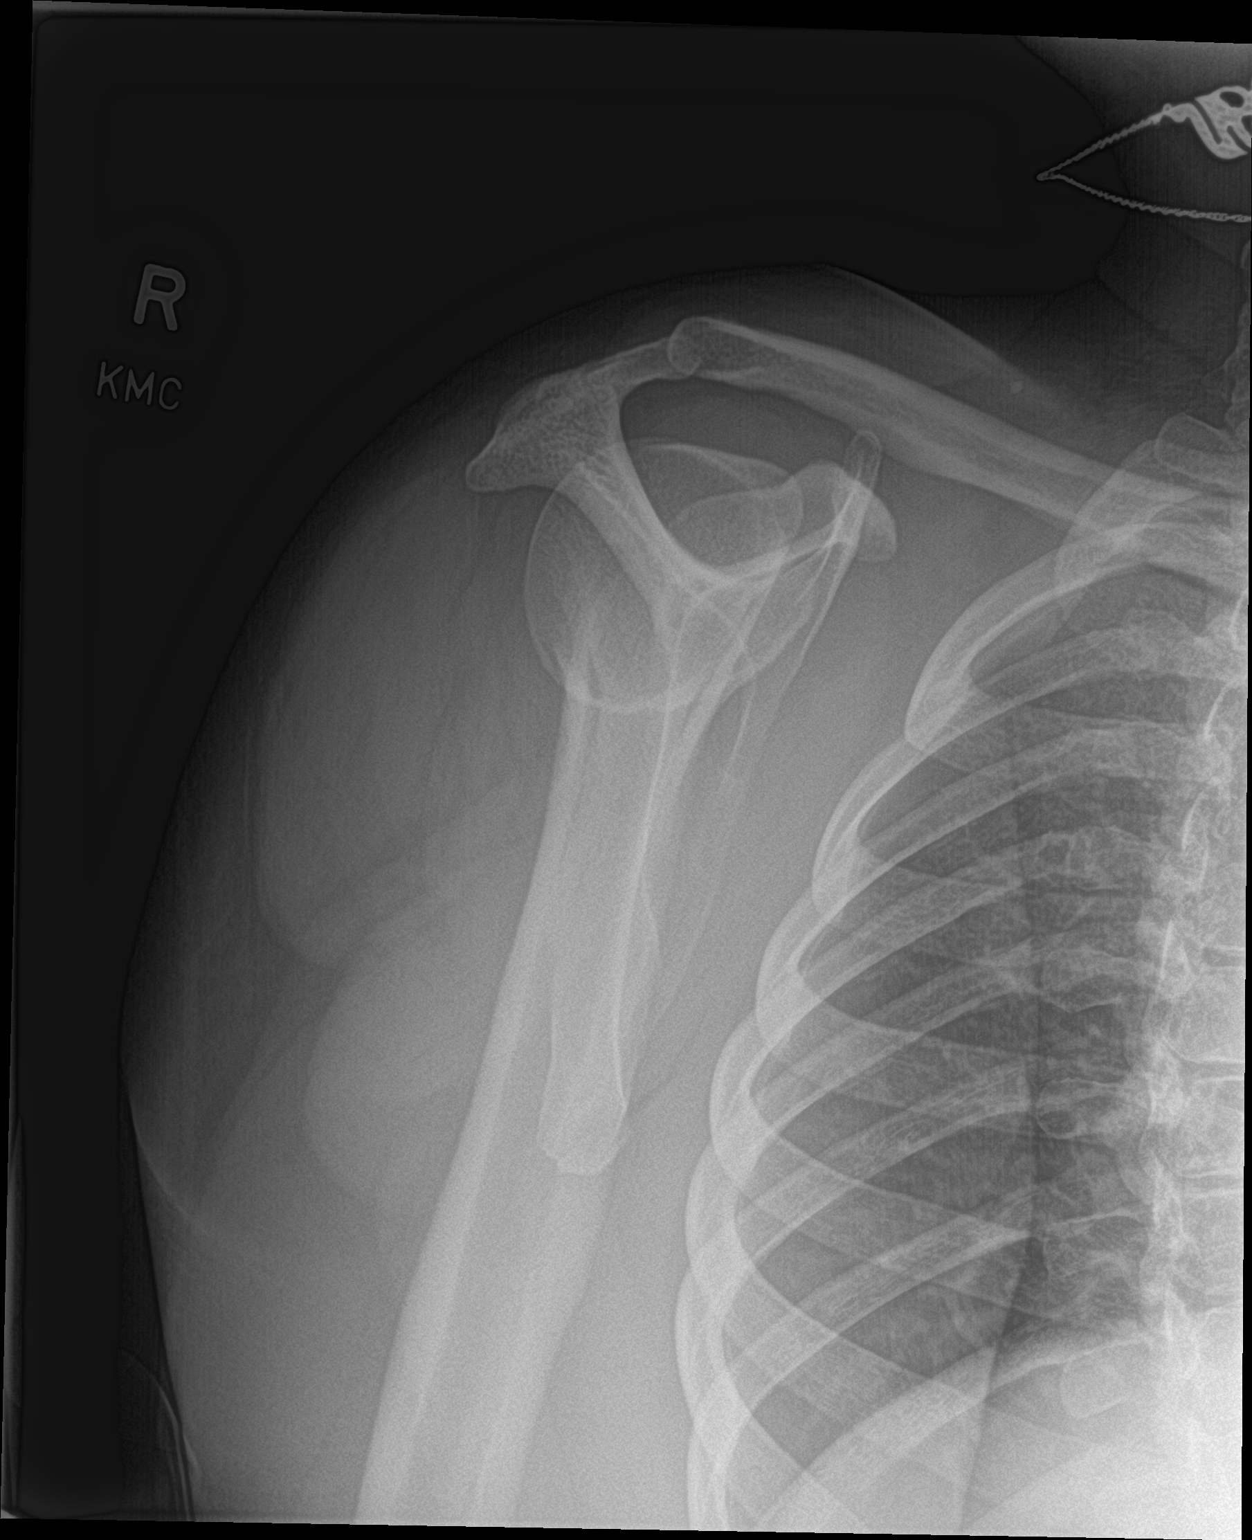

[shoulder ap neutral]
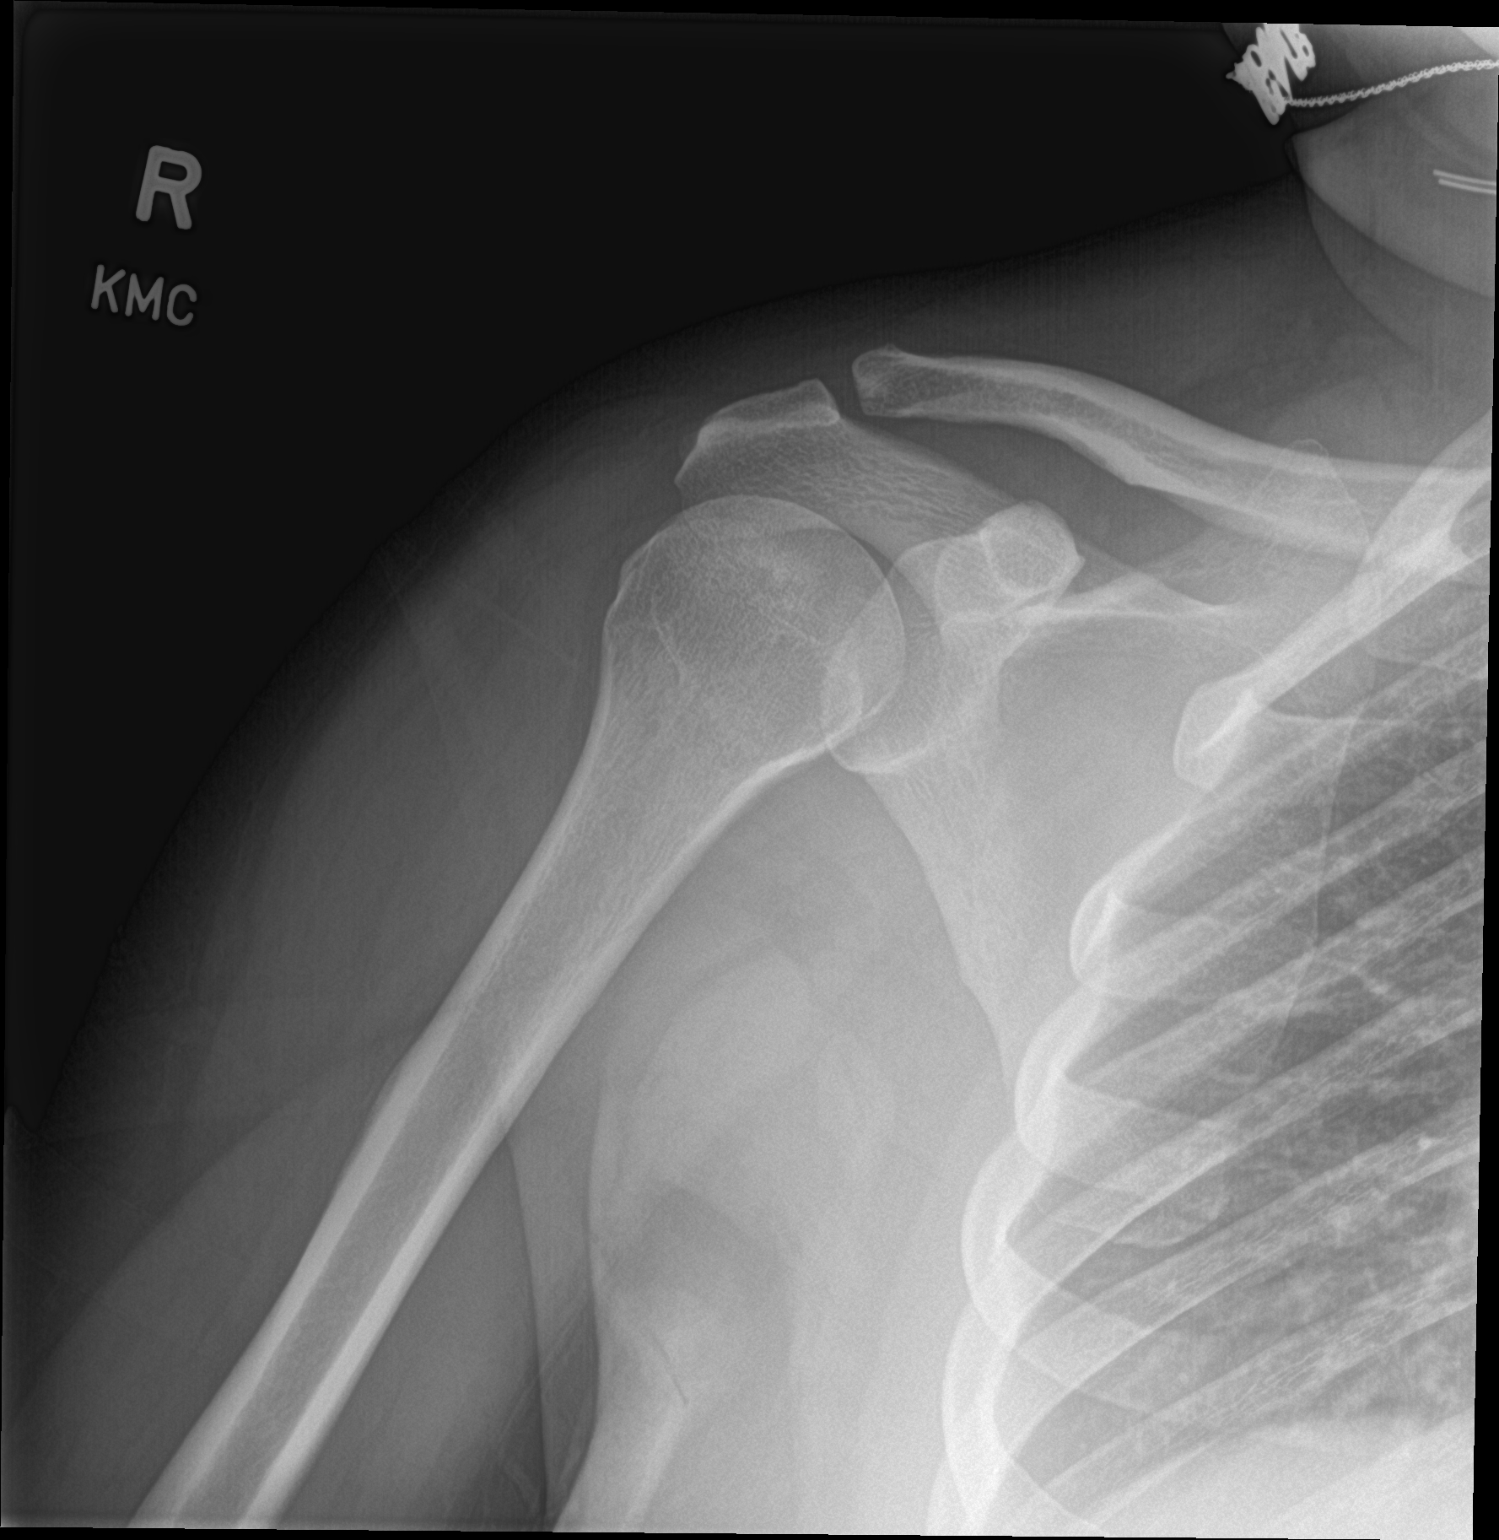

[shoulder grashey]
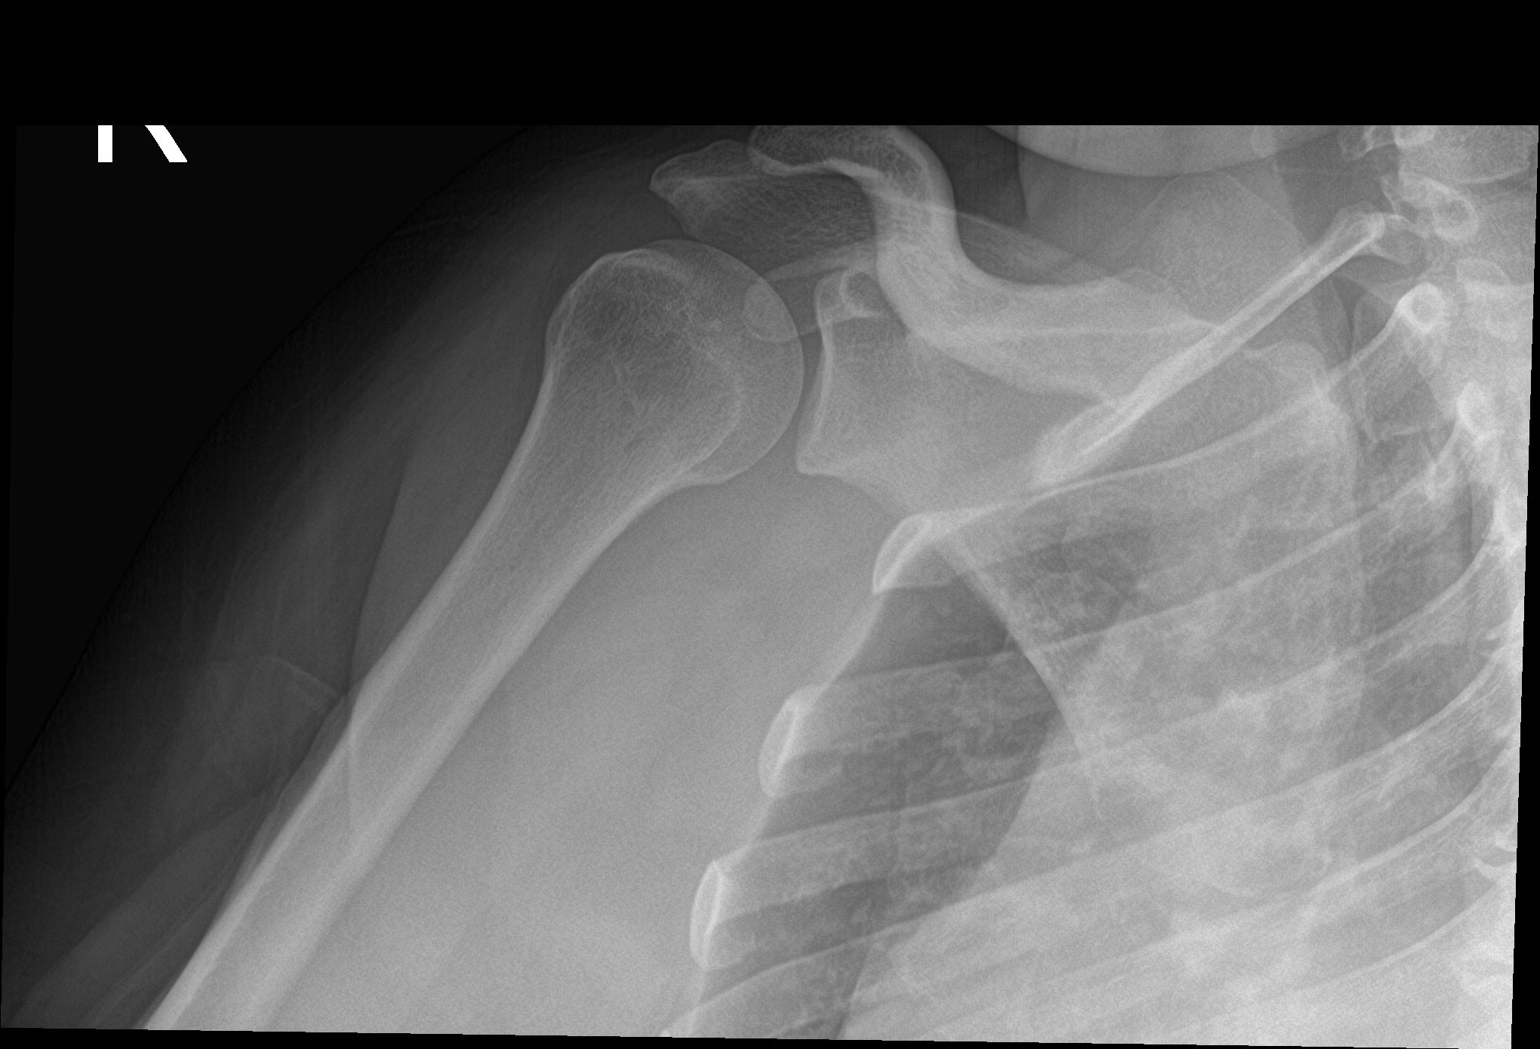

[3 of 3 positions shown; findings below may reference images not displayed]

FINDINGS: There is no evidence of fracture or dislocation. There is no
evidence of arthropathy or other focal bone abnormality. Soft
tissues are unremarkable.
IMPRESSION: Normal exam.

## 2022-05-23 ENCOUNTER — Ambulatory Visit: Payer: Medicaid Other | Admitting: Obstetrics & Gynecology

## 2022-06-05 ENCOUNTER — Other Ambulatory Visit: Payer: Self-pay | Admitting: Internal Medicine

## 2022-06-05 DIAGNOSIS — I10 Essential (primary) hypertension: Secondary | ICD-10-CM

## 2022-06-05 DIAGNOSIS — I119 Hypertensive heart disease without heart failure: Secondary | ICD-10-CM

## 2022-06-18 ENCOUNTER — Ambulatory Visit (INDEPENDENT_AMBULATORY_CARE_PROVIDER_SITE_OTHER): Payer: Medicaid Other

## 2022-06-18 ENCOUNTER — Ambulatory Visit: Payer: Medicaid Other | Admitting: Internal Medicine

## 2022-06-18 ENCOUNTER — Other Ambulatory Visit: Payer: Self-pay

## 2022-06-18 ENCOUNTER — Encounter: Payer: Self-pay | Admitting: Internal Medicine

## 2022-06-18 VITALS — BP 124/76 | HR 90 | Temp 98.2°F | Resp 16 | Ht 61.0 in | Wt 223.0 lb

## 2022-06-18 DIAGNOSIS — N921 Excessive and frequent menstruation with irregular cycle: Secondary | ICD-10-CM

## 2022-06-18 DIAGNOSIS — H43392 Other vitreous opacities, left eye: Secondary | ICD-10-CM | POA: Diagnosis not present

## 2022-06-18 DIAGNOSIS — I10 Essential (primary) hypertension: Secondary | ICD-10-CM

## 2022-06-18 DIAGNOSIS — Z0001 Encounter for general adult medical examination with abnormal findings: Secondary | ICD-10-CM

## 2022-06-18 DIAGNOSIS — D520 Dietary folate deficiency anemia: Secondary | ICD-10-CM

## 2022-06-18 DIAGNOSIS — R7989 Other specified abnormal findings of blood chemistry: Secondary | ICD-10-CM

## 2022-06-18 DIAGNOSIS — M5441 Lumbago with sciatica, right side: Secondary | ICD-10-CM

## 2022-06-18 DIAGNOSIS — M545 Low back pain, unspecified: Secondary | ICD-10-CM | POA: Diagnosis not present

## 2022-06-18 DIAGNOSIS — R29898 Other symptoms and signs involving the musculoskeletal system: Secondary | ICD-10-CM

## 2022-06-18 DIAGNOSIS — D500 Iron deficiency anemia secondary to blood loss (chronic): Secondary | ICD-10-CM

## 2022-06-18 LAB — CBC WITH DIFFERENTIAL/PLATELET
Basophils Absolute: 0.1 10*3/uL (ref 0.0–0.1)
Basophils Relative: 0.8 % (ref 0.0–3.0)
Eosinophils Absolute: 0.1 10*3/uL (ref 0.0–0.7)
Eosinophils Relative: 0.7 % (ref 0.0–5.0)
HCT: 34 % — ABNORMAL LOW (ref 36.0–46.0)
Hemoglobin: 11.2 g/dL — ABNORMAL LOW (ref 12.0–15.0)
Lymphocytes Relative: 22.8 % (ref 12.0–46.0)
Lymphs Abs: 1.8 10*3/uL (ref 0.7–4.0)
MCHC: 33 g/dL (ref 30.0–36.0)
MCV: 70.3 fl — ABNORMAL LOW (ref 78.0–100.0)
Monocytes Absolute: 0.7 10*3/uL (ref 0.1–1.0)
Monocytes Relative: 9 % (ref 3.0–12.0)
Neutro Abs: 5.4 10*3/uL (ref 1.4–7.7)
Neutrophils Relative %: 66.7 % (ref 43.0–77.0)
Platelets: 357 10*3/uL (ref 150.0–400.0)
RBC: 4.84 Mil/uL (ref 3.87–5.11)
RDW: 18.5 % — ABNORMAL HIGH (ref 11.5–15.5)
WBC: 8.1 10*3/uL (ref 4.0–10.5)

## 2022-06-18 LAB — IBC + FERRITIN
Ferritin: 11.3 ng/mL (ref 10.0–291.0)
Iron: 34 ug/dL — ABNORMAL LOW (ref 42–145)
Saturation Ratios: 7.5 % — ABNORMAL LOW (ref 20.0–50.0)
TIBC: 452.2 ug/dL — ABNORMAL HIGH (ref 250.0–450.0)
Transferrin: 323 mg/dL (ref 212.0–360.0)

## 2022-06-18 MED ORDER — SEMAGLUTIDE-WEIGHT MANAGEMENT 1 MG/0.5ML ~~LOC~~ SOAJ
1.0000 mg | SUBCUTANEOUS | 0 refills | Status: AC
  Filled 2022-06-18: qty 2, 28d supply, fill #0

## 2022-06-18 MED ORDER — SEMAGLUTIDE-WEIGHT MANAGEMENT 0.25 MG/0.5ML ~~LOC~~ SOAJ
0.2500 mg | SUBCUTANEOUS | 0 refills | Status: AC
  Filled 2022-06-18 – 2022-06-28 (×2): qty 2, 28d supply, fill #0

## 2022-06-18 MED ORDER — SEMAGLUTIDE-WEIGHT MANAGEMENT 0.5 MG/0.5ML ~~LOC~~ SOAJ
0.5000 mg | SUBCUTANEOUS | 0 refills | Status: AC
  Filled 2022-06-18 – 2022-07-20 (×3): qty 2, 28d supply, fill #0

## 2022-06-18 MED ORDER — ACCRUFER 30 MG PO CAPS: 1.0000 | ORAL_CAPSULE | Freq: Two times a day (BID) | ORAL | 1 refills | Status: DC

## 2022-06-18 MED ORDER — SEMAGLUTIDE-WEIGHT MANAGEMENT 2.4 MG/0.75ML ~~LOC~~ SOAJ
2.4000 mg | SUBCUTANEOUS | 0 refills | Status: AC
  Filled 2022-06-18: qty 3, 28d supply, fill #0

## 2022-06-18 MED ORDER — SEMAGLUTIDE-WEIGHT MANAGEMENT 1.7 MG/0.75ML ~~LOC~~ SOAJ
1.7000 mg | SUBCUTANEOUS | 0 refills | Status: AC
  Filled 2022-06-18: qty 3, 28d supply, fill #0

## 2022-06-18 NOTE — Progress Notes (Signed)
Subjective:  Patient ID: Christina Bruce, female    DOB: [birth year]  Age: 41 y.o. MRN: [id number]  CC: Back Pain, Hypertension, and Anemia   HPI NOVEMBER SYPHER presents for f/up -  She complains of a 1 month history of cloudy spots and blurred vision in her left eye.  She denies photophobia.  She also complains of a 73-month history of right lower back pain.  The pain radiates into her right lower extremity and she has right lower extremity tingling.  She has gotten some symptom relief with Aleve.  Outpatient Medications Prior to Visit  Medication Sig Dispense Refill   amLODipine (NORVASC) 5 MG tablet Take 1 tablet (5 mg total) by mouth daily. 90 tablet 0   folic acid (FOLVITE) 1 MG tablet Take 1 tablet (1 mg total) by mouth daily. 90 tablet 1   pantoprazole (PROTONIX) 20 MG tablet Take 20 mg by mouth 2 (two) times daily.     SV VITAMIN B-12 ER 1000 MCG TBCR Take 1 tablet by mouth daily.     Ferric Maltol (ACCRUFER) 30 MG CAPS Take 1 capsule by mouth in the morning and at bedtime. 180 capsule 1   indapamide (LOZOL) 1.25 MG tablet Take 1 tablet by mouth once daily 90 tablet 0   Semaglutide-Weight Management 1.7 MG/0.75ML SOAJ Inject 1.7 mg into the skin once a week for 28 days. 3 mL 0   [START ON 07/09/2022] Semaglutide-Weight Management 2.4 MG/0.75ML SOAJ Inject 2.4 mg into the skin once a week for 28 days. 3 mL 0   No facility-administered medications prior to visit.    ROS Review of Systems  Constitutional:  Negative for diaphoresis, fatigue and fever.  HENT: Negative.    Eyes:  Positive for visual disturbance. Negative for photophobia, pain and redness.  Respiratory:  Negative for cough, chest tightness, shortness of breath and wheezing.   Cardiovascular:  Negative for chest pain, palpitations and leg swelling.  Gastrointestinal:  Negative for abdominal pain, constipation, diarrhea, nausea and vomiting.  Endocrine: Negative.   Genitourinary:  Positive for vaginal bleeding.  Negative for difficulty urinating and vaginal discharge.  Musculoskeletal:  Positive for back pain. Negative for arthralgias, myalgias and neck pain.  Skin: Negative.   Neurological:  Negative for dizziness, weakness and numbness.  Hematological:  Negative for adenopathy. Does not bruise/bleed easily.    Objective:  BP 124/76 (BP Location: Left Arm, Patient Position: Sitting, Cuff Size: Large)   Pulse 90   Temp 98.2 F (36.8 C) (Oral)   Resp 16   Ht 5\' 1"  (1.549 m)   Wt 223 lb (101.2 kg)   LMP 05/22/2022 (Approximate) Comment: BTL  SpO2 97%   BMI 42.14 kg/m   BP Readings from Last 3 Encounters:  06/18/22 124/76  03/15/22 (!) 144/92  12/21/21 132/80    Wt Readings from Last 3 Encounters:  06/18/22 223 lb (101.2 kg)  03/15/22 231 lb (104.8 kg)  12/21/21 227 lb (103 kg)    Physical Exam Vitals reviewed.  HENT:     Nose: Nose normal.     Mouth/Throat:     Mouth: Mucous membranes are moist.  Eyes:     General: No scleral icterus.    Conjunctiva/sclera: Conjunctivae normal.  Cardiovascular:     Rate and Rhythm: Normal rate and regular rhythm.     Heart sounds: No murmur heard. Pulmonary:     Effort: Pulmonary effort is normal.     Breath sounds: No stridor. No wheezing, rhonchi  or rales.  Abdominal:     General: Abdomen is flat.     Palpations: There is no mass.     Tenderness: There is no abdominal tenderness. There is no guarding.     Hernia: No hernia is present.  Musculoskeletal:     Cervical back: Normal and neck supple.     Thoracic back: Normal.     Lumbar back: No swelling or deformity. Normal range of motion. Positive right straight leg raise test. Negative left straight leg raise test.     Right lower leg: No edema.     Left lower leg: No edema.  Lymphadenopathy:     Cervical: No cervical adenopathy.  Skin:    General: Skin is warm and dry.  Neurological:     General: No focal deficit present.     Cranial Nerves: Cranial nerves 2-12 are intact.      Motor: Weakness present. No tremor or atrophy.     Coordination: Coordination is intact.     Gait: Gait is intact. Gait normal.     Deep Tendon Reflexes: Reflexes normal.     Reflex Scores:      Tricep reflexes are 0 on the right side and 0 on the left side.      Bicep reflexes are 0 on the right side and 0 on the left side.      Brachioradialis reflexes are 0 on the right side and 0 on the left side.      Patellar reflexes are 0 on the right side and 0 on the left side.      Achilles reflexes are 0 on the right side and 0 on the left side.    Comments: Mild weakness in the RLE  Psychiatric:        Mood and Affect: Mood normal.        Behavior: Behavior normal.     Lab Results  Component Value Date   WBC 8.1 06/18/2022   HGB 11.2 (L) 06/18/2022   HCT 34.0 (L) 06/18/2022   PLT 357.0 06/18/2022   GLUCOSE 94 12/13/2021   ALT 20 06/17/2021   AST 21 06/17/2021   NA 137 12/13/2021   K 3.7 12/13/2021   CL 101 12/13/2021   CREATININE 0.72 12/13/2021   BUN 8 12/13/2021   CO2 28 12/13/2021   TSH 1.22 05/24/2021    No results found.   Assessment & Plan:   Janisha was seen today for back pain, hypertension and anemia.  Diagnoses and all orders for this visit:  Primary hypertension- Her blood pressure is adequately well controlled.  Iron deficiency anemia due to chronic blood loss -     CBC with Differential/Platelet; Future -     IBC + Ferritin; Future -     IBC + Ferritin -     CBC with Differential/Platelet -     Ferric Maltol (ACCRUFER) 30 MG CAPS; Take 1 capsule by mouth in the morning and at bedtime.  Low vitamin B12 level -     CBC with Differential/Platelet; Future -     CBC with Differential/Platelet  Dietary folate deficiency anemia -     CBC with Differential/Platelet; Future -     CBC with Differential/Platelet  Acute right-sided low back pain with right-sided sciatica -     DG Lumbar Spine Complete; Future -     MR Lumbar Spine Wo Contrast;  Future  Floaters in visual field, left -     Ambulatory referral to  Ophthalmology  Obesity, morbid, BMI 40.0-49.9 (HCC) -     Semaglutide-Weight Management 0.25 MG/0.5ML SOAJ; Inject 0.25 mg into the skin once a week for 28 days. -     Semaglutide-Weight Management 0.5 MG/0.5ML SOAJ; Inject 0.5 mg into the skin once a week for 28 days. -     Semaglutide-Weight Management 1 MG/0.5ML SOAJ; Inject 1 mg into the skin once a week for 28 days. -     Semaglutide-Weight Management 1.7 MG/0.75ML SOAJ; Inject 1.7 mg into the skin once a week for 28 days. -     Semaglutide-Weight Management 2.4 MG/0.75ML SOAJ; Inject 2.4 mg into the skin once a week for 28 days.  Menometrorrhagia -     Ambulatory referral to Gynecology  Weakness of right leg- Will evaluate for a lumbar lesion. -     MR Lumbar Spine Wo Contrast; Future  Encounter for general adult medical examination with abnormal findings- Exam completed, labs reviewed, she refused a flu vaccine, cancer screenings addressed, patient education was given.   I have discontinued Levon Hedger Semaglutide-Weight Management, Semaglutide-Weight Management, and indapamide. I am also having her start on Semaglutide-Weight Management, Semaglutide-Weight Management, Semaglutide-Weight Management, Semaglutide-Weight Management, and Semaglutide-Weight Management. Additionally, I am having her maintain her SV Vitamin B-12 ER, pantoprazole, folic acid, amLODipine, and ACCRUFeR.  Meds ordered this encounter  Medications   Ferric Maltol (ACCRUFER) 30 MG CAPS    Sig: Take 1 capsule by mouth in the morning and at bedtime.    Dispense:  180 capsule    Refill:  1   Semaglutide-Weight Management 0.25 MG/0.5ML SOAJ    Sig: Inject 0.25 mg into the skin once a week for 28 days.    Dispense:  2 mL    Refill:  0   Semaglutide-Weight Management 0.5 MG/0.5ML SOAJ    Sig: Inject 0.5 mg into the skin once a week for 28 days.    Dispense:  2 mL    Refill:  0    Semaglutide-Weight Management 1 MG/0.5ML SOAJ    Sig: Inject 1 mg into the skin once a week for 28 days.    Dispense:  2 mL    Refill:  0   Semaglutide-Weight Management 1.7 MG/0.75ML SOAJ    Sig: Inject 1.7 mg into the skin once a week for 28 days.    Dispense:  3 mL    Refill:  0   Semaglutide-Weight Management 2.4 MG/0.75ML SOAJ    Sig: Inject 2.4 mg into the skin once a week for 28 days.    Dispense:  3 mL    Refill:  0     Follow-up: Return in about 3 months (around 09/18/2022).  Sanda Linger, MD

## 2022-06-18 NOTE — Patient Instructions (Signed)
Sciatica  Sciatica is pain, numbness, weakness, or tingling along the path of the sciatic nerve. The sciatic nerve starts in the lower back and runs down the back of each leg. The nerve controls the muscles in the lower leg and in the back of the knee. It also provides feeling (sensation) to the back of the thigh, the lower leg, and the sole of the foot. Sciatica is a symptom of another medical condition that pinches or puts pressure on the sciatic nerve. Sciatica most often only affects one side of the body. Sciatica usually goes away on its own or with treatment. In some cases, sciatica may come back (recur). What are the causes? This condition is caused by pressure on the sciatic nerve or pinching of the nerve. This may be the result of: A disk in between the bones of the spine bulging out too far (herniated disk). Age-related changes in the spinal disks. A pain disorder that affects a muscle in the buttock. Extra bone growth near the sciatic nerve. A break (fracture) of the pelvis. Pregnancy. Tumor. This is rare. What increases the risk? The following factors may make you more likely to develop this condition: Playing sports that place pressure or stress on the spine. Having poor strength and flexibility. A history of back injury or surgery. Sitting for long periods of time. Doing activities that involve repetitive bending or lifting. Obesity. What are the signs or symptoms? Symptoms can vary from mild to very severe. They may include: Any of the following problems in the lower back, leg, hip, or buttock: Mild tingling, numbness, or dull aches. Burning sensations. Sharp pains. Numbness in the back of the calf or the sole of the foot. Leg weakness. Severe back pain that makes movement difficult. Symptoms may get worse when you cough, sneeze, or laugh, or when you sit or stand for long periods of time. How is this diagnosed? This condition may be diagnosed based on: Your symptoms  and medical history. A physical exam. Blood tests. Imaging tests, such as: X-rays. An MRI. A CT scan. How is this treated? In many cases, this condition improves on its own without treatment. However, treatment may include: Reducing or modifying physical activity. Exercising, including strengthening and stretching. Icing and applying heat to the affected area. Medicines that help to: Relieve pain and swelling. Relax your muscles. Injections of medicines that help to relieve pain and inflammation (steroids) around the sciatic nerve. Surgery. Follow these instructions at home: Medicines Take over-the-counter and prescription medicines only as told by your health care provider. Ask your health care provider if the medicine prescribed to you requires you to avoid driving or using heavy machinery. Managing pain     If directed, put ice on the affected area. To do this: Put ice in a plastic bag. Place a towel between your skin and the bag. Leave the ice on for 20 minutes, 2-3 times a day. If your skin turns bright red, remove the ice right away to prevent skin damage. The risk of skin damage is higher if you cannot feel pain, heat, or cold. If directed, apply heat to the affected area as often as told by your health care provider. Use the heat source that your health care provider recommends, such as a moist heat pack or a heating pad. Place a towel between your skin and the heat source. Leave the heat on for 20-30 minutes. If your skin turns bright red, remove the heat right away to prevent burns. The   risk of burns is higher if you cannot feel pain, heat, or cold. Activity  Return to your normal activities as told by your health care provider. Ask your health care provider what activities are safe for you. Avoid activities that make your symptoms worse. Take brief periods of rest throughout the day. When you rest for longer periods, mix in some mild activity or stretching between  periods of rest. This will help to prevent stiffness and pain. Avoid sitting for long periods of time without moving. Get up and move around at least one time each hour. Exercise and stretch regularly as told by your health care provider. Do not lift anything that is heavier than 10 lb (4.5 kg) until your health care provider says that it is safe. When you do not have symptoms, you should still avoid heavy lifting, especially repetitive heavy lifting. When you lift objects, always use proper lifting technique, which includes: Bending your knees. Keeping the load close to your body. Avoiding twisting. General instructions Maintain a healthy weight. Excess weight puts extra stress on your back. Wear supportive, comfortable shoes. Avoid wearing high heels. Avoid sleeping on a mattress that is too soft or too hard. A mattress that is firm enough to support your back when you sleep may help to reduce your pain. Contact a health care provider if: Your pain is not controlled by medicine. Your pain does not improve or gets worse. Your pain lasts longer than 4 weeks. You have unexplained weight loss. Get help right away if: You are not able to control when you urinate or have bowel movements (incontinence). You have: Weakness in your lower back, pelvis, buttocks, or legs that gets worse. Redness or swelling of your back. A burning sensation when you urinate. Summary Sciatica is pain, numbness, weakness, or tingling along the path of the sciatic nerve, which may include the lower back, legs, hips, and buttocks. This condition is caused by pressure on the sciatic nerve or pinching of the nerve. Treatment often includes rest, exercise, medicines, and applying ice or heat. This information is not intended to replace advice given to you by your health care provider. Make sure you discuss any questions you have with your health care provider. Document Revised: 12/11/2021 Document Reviewed:  12/11/2021 Elsevier Patient Education  2023 [employer]  

## 2022-06-20 ENCOUNTER — Other Ambulatory Visit: Payer: Self-pay

## 2022-06-24 DIAGNOSIS — Z79899 Other long term (current) drug therapy: Secondary | ICD-10-CM | POA: Insufficient documentation

## 2022-06-24 DIAGNOSIS — Z0001 Encounter for general adult medical examination with abnormal findings: Secondary | ICD-10-CM | POA: Insufficient documentation

## 2022-06-28 ENCOUNTER — Other Ambulatory Visit: Payer: Self-pay

## 2022-07-02 ENCOUNTER — Ambulatory Visit
Admission: RE | Admit: 2022-07-02 | Discharge: 2022-07-02 | Disposition: A | Discharge status: Home / Self Care | Payer: Medicaid Other | Source: Ambulatory Visit | Attending: Internal Medicine | Admitting: Internal Medicine

## 2022-07-02 DIAGNOSIS — M5441 Lumbago with sciatica, right side: Secondary | ICD-10-CM

## 2022-07-02 DIAGNOSIS — R29898 Other symptoms and signs involving the musculoskeletal system: Secondary | ICD-10-CM

## 2022-07-02 DIAGNOSIS — M5127 Other intervertebral disc displacement, lumbosacral region: Secondary | ICD-10-CM | POA: Diagnosis not present

## 2022-07-03 ENCOUNTER — Other Ambulatory Visit: Payer: Self-pay | Admitting: Internal Medicine

## 2022-07-03 ENCOUNTER — Other Ambulatory Visit: Payer: Self-pay

## 2022-07-03 DIAGNOSIS — M5106 Intervertebral disc disorders with myelopathy, lumbar region: Secondary | ICD-10-CM | POA: Insufficient documentation

## 2022-07-05 ENCOUNTER — Other Ambulatory Visit: Payer: Self-pay

## 2022-07-06 ENCOUNTER — Other Ambulatory Visit: Payer: Self-pay

## 2022-07-09 DIAGNOSIS — M5127 Other intervertebral disc displacement, lumbosacral region: Secondary | ICD-10-CM | POA: Diagnosis not present

## 2022-07-09 DIAGNOSIS — G5621 Lesion of ulnar nerve, right upper limb: Secondary | ICD-10-CM | POA: Diagnosis not present

## 2022-07-09 DIAGNOSIS — G5601 Carpal tunnel syndrome, right upper limb: Secondary | ICD-10-CM | POA: Diagnosis not present

## 2022-07-10 ENCOUNTER — Other Ambulatory Visit: Payer: Self-pay

## 2022-07-11 ENCOUNTER — Other Ambulatory Visit: Payer: Self-pay | Admitting: Neurological Surgery

## 2022-07-11 DIAGNOSIS — M5412 Radiculopathy, cervical region: Secondary | ICD-10-CM

## 2022-07-12 ENCOUNTER — Other Ambulatory Visit: Payer: Self-pay

## 2022-07-13 ENCOUNTER — Other Ambulatory Visit: Payer: Self-pay | Admitting: Internal Medicine

## 2022-07-13 DIAGNOSIS — I119 Hypertensive heart disease without heart failure: Secondary | ICD-10-CM

## 2022-07-13 DIAGNOSIS — I10 Essential (primary) hypertension: Secondary | ICD-10-CM

## 2022-07-16 ENCOUNTER — Other Ambulatory Visit: Payer: Self-pay

## 2022-07-18 ENCOUNTER — Encounter (INDEPENDENT_AMBULATORY_CARE_PROVIDER_SITE_OTHER): Payer: Medicaid Other | Admitting: Family Medicine

## 2022-07-19 ENCOUNTER — Other Ambulatory Visit: Payer: Self-pay

## 2022-07-20 ENCOUNTER — Other Ambulatory Visit: Payer: Self-pay

## 2022-07-26 ENCOUNTER — Ambulatory Visit: Payer: Medicaid Other | Admitting: Obstetrics

## 2022-07-27 ENCOUNTER — Other Ambulatory Visit: Payer: Medicaid Other

## 2022-09-18 ENCOUNTER — Ambulatory Visit: Payer: Medicaid Other | Admitting: Internal Medicine

## 2022-11-09 ENCOUNTER — Other Ambulatory Visit: Payer: Self-pay | Admitting: Internal Medicine

## 2022-11-09 DIAGNOSIS — I119 Hypertensive heart disease without heart failure: Secondary | ICD-10-CM

## 2022-11-09 DIAGNOSIS — I10 Essential (primary) hypertension: Secondary | ICD-10-CM

## 2022-11-09 MED ORDER — AMLODIPINE BESYLATE 5 MG PO TABS: 5.0000 mg | ORAL_TABLET | Freq: Every day | ORAL | 0 refills | Status: DC

## 2022-11-30 ENCOUNTER — Encounter: Payer: Self-pay | Admitting: Internal Medicine

## 2022-12-01 ENCOUNTER — Other Ambulatory Visit: Payer: Self-pay | Admitting: Internal Medicine

## 2022-12-01 DIAGNOSIS — D520 Dietary folate deficiency anemia: Secondary | ICD-10-CM

## 2022-12-01 DIAGNOSIS — D500 Iron deficiency anemia secondary to blood loss (chronic): Secondary | ICD-10-CM

## 2022-12-01 MED ORDER — FOLIC ACID 1 MG PO TABS: 1.0000 mg | ORAL_TABLET | Freq: Every day | ORAL | 1 refills | Status: DC

## 2022-12-01 MED ORDER — ACCRUFER 30 MG PO CAPS: 1.0000 | ORAL_CAPSULE | Freq: Two times a day (BID) | ORAL | 1 refills | Status: DC

## 2022-12-31 ENCOUNTER — Encounter: Payer: Self-pay | Admitting: *Deleted

## 2023-02-04 ENCOUNTER — Other Ambulatory Visit (HOSPITAL_COMMUNITY): Payer: Self-pay

## 2023-02-04 ENCOUNTER — Telehealth: Payer: Self-pay

## 2023-02-04 NOTE — Telephone Encounter (Signed)
Prior authorization for ACCRUFeR 30MG  capsules submitted and APPROVED through[employer] [place] [employer] (ins).. Test billing returns $4.00 copay for 30 day supply.    Key [id number]  Spoke with Pharmacy to process they told me that they could not just run through there Medicaid at that pharmacy, but pt could take  to different pharmacy and just run through [facility]  Copay of  $4.00 Effective:02/04/2023   Melanee Spry CPhT [facility] [phone]

## 2023-02-05 NOTE — Telephone Encounter (Signed)
Sent pt msg via mychart PA was approved.Marland KitchenRaechel Chute

## 2023-05-06 ENCOUNTER — Other Ambulatory Visit: Payer: Self-pay | Admitting: Internal Medicine

## 2023-05-06 ENCOUNTER — Telehealth: Payer: Self-pay | Admitting: Internal Medicine

## 2023-05-06 DIAGNOSIS — I10 Essential (primary) hypertension: Secondary | ICD-10-CM

## 2023-05-06 DIAGNOSIS — I119 Hypertensive heart disease without heart failure: Secondary | ICD-10-CM

## 2023-05-06 MED ORDER — AMLODIPINE BESYLATE 5 MG PO TABS
5.0000 mg | ORAL_TABLET | Freq: Every day | ORAL | 0 refills | Status: DC
Start: 2023-05-06 — End: 2023-06-11

## 2023-05-06 NOTE — Telephone Encounter (Signed)
Prescription Request  05/06/2023  LOV: 06/18/2022 Pt was inquiring about a temporary refill until her appt What is the name of the medication or equipment? amLODipine (NORVASC) 5 MG tablet [  Have you contacted your pharmacy to request a refill? No   Which pharmacy would you like this sent to?    [facility] [address] [place], [place] - [address] [place] [address] Phone: [phone] Fax: [phone]   Patient notified that their request is being sent to the clinical staff for review and that they should receive a response within 2 business days.   Please advise at Mobile [phone] (mobile)

## 2023-06-10 ENCOUNTER — Ambulatory Visit (INDEPENDENT_AMBULATORY_CARE_PROVIDER_SITE_OTHER): Payer: BC Managed Care – PPO | Admitting: Internal Medicine

## 2023-06-10 ENCOUNTER — Encounter: Payer: Self-pay | Admitting: Internal Medicine

## 2023-06-10 VITALS — BP 128/84 | HR 90 | Temp 98.2°F | Resp 16 | Ht 61.0 in | Wt 219.0 lb

## 2023-06-10 DIAGNOSIS — Z0000 Encounter for general adult medical examination without abnormal findings: Secondary | ICD-10-CM

## 2023-06-10 DIAGNOSIS — Z1159 Encounter for screening for other viral diseases: Secondary | ICD-10-CM

## 2023-06-10 DIAGNOSIS — I119 Hypertensive heart disease without heart failure: Secondary | ICD-10-CM | POA: Diagnosis not present

## 2023-06-10 DIAGNOSIS — Z124 Encounter for screening for malignant neoplasm of cervix: Secondary | ICD-10-CM | POA: Insufficient documentation

## 2023-06-10 DIAGNOSIS — I10 Essential (primary) hypertension: Secondary | ICD-10-CM | POA: Diagnosis not present

## 2023-06-10 DIAGNOSIS — Z0001 Encounter for general adult medical examination with abnormal findings: Secondary | ICD-10-CM

## 2023-06-10 DIAGNOSIS — D500 Iron deficiency anemia secondary to blood loss (chronic): Secondary | ICD-10-CM | POA: Diagnosis not present

## 2023-06-10 DIAGNOSIS — R778 Other specified abnormalities of plasma proteins: Secondary | ICD-10-CM

## 2023-06-10 DIAGNOSIS — F5104 Psychophysiologic insomnia: Secondary | ICD-10-CM

## 2023-06-10 DIAGNOSIS — H608X3 Other otitis externa, bilateral: Secondary | ICD-10-CM

## 2023-06-10 LAB — URINALYSIS, ROUTINE W REFLEX MICROSCOPIC
Bilirubin Urine: NEGATIVE
Hgb urine dipstick: NEGATIVE
Ketones, ur: NEGATIVE
Leukocytes,Ua: NEGATIVE
Nitrite: NEGATIVE
Specific Gravity, Urine: 1.02 (ref 1.000–1.030)
Total Protein, Urine: NEGATIVE
Urine Glucose: NEGATIVE
Urobilinogen, UA: 1 (ref 0.0–1.0)
pH: 7 (ref 5.0–8.0)

## 2023-06-10 LAB — CBC WITH DIFFERENTIAL/PLATELET
Basophils Absolute: 0.1 10*3/uL (ref 0.0–0.1)
Basophils Relative: 0.6 % (ref 0.0–3.0)
Eosinophils Absolute: 0 10*3/uL (ref 0.0–0.7)
Eosinophils Relative: 0.4 % (ref 0.0–5.0)
HCT: 35.9 % — ABNORMAL LOW (ref 36.0–46.0)
Hemoglobin: 11.2 g/dL — ABNORMAL LOW (ref 12.0–15.0)
Lymphocytes Relative: 28.3 % (ref 12.0–46.0)
Lymphs Abs: 2.6 10*3/uL (ref 0.7–4.0)
MCHC: 31.2 g/dL (ref 30.0–36.0)
MCV: 71.5 fl — ABNORMAL LOW (ref 78.0–100.0)
Monocytes Absolute: 0.8 10*3/uL (ref 0.1–1.0)
Monocytes Relative: 8.3 % (ref 3.0–12.0)
Neutro Abs: 5.8 10*3/uL (ref 1.4–7.7)
Neutrophils Relative %: 62.4 % (ref 43.0–77.0)
Platelets: 435 10*3/uL — ABNORMAL HIGH (ref 150.0–400.0)
RBC: 5.02 Mil/uL (ref 3.87–5.11)
RDW: 17.8 % — ABNORMAL HIGH (ref 11.5–15.5)
WBC: 9.3 10*3/uL (ref 4.0–10.5)

## 2023-06-10 LAB — HEMOGLOBIN A1C: Hgb A1c MFr Bld: 6 % (ref 4.6–6.5)

## 2023-06-10 LAB — TSH: TSH: 1.33 u[IU]/mL (ref 0.35–5.50)

## 2023-06-10 MED ORDER — NEOMYCIN-POLYMYXIN-HC 3.5-10000-1 OT SOLN: 3.0000 [drp] | Freq: Three times a day (TID) | OTIC | 1 refills | Status: DC

## 2023-06-10 MED ORDER — ESZOPICLONE 3 MG PO TABS
3.0000 mg | ORAL_TABLET | Freq: Every day | ORAL | 1 refills | Status: DC
Start: 2023-06-10 — End: 2023-12-18

## 2023-06-10 NOTE — Progress Notes (Unsigned)
Subjective:  Patient ID: Christina Bruce, female    DOB: July 06, 1981  Age: 42 y.o. MRN: [id number]  CC: Annual Exam, Hypertension, and Anemia   HPI Christina Bruce presents for a CPX and f/up -  Discussed the use of AI scribe software for clinical note transcription with the patient, who gave verbal consent to proceed.  History of Present Illness   The patient, with a history of hypertension, presents with insomnia, itchy ears, and difficulty losing weight. She reports that her insomnia has returned after a period of control with Ambien, and she now struggles to fall asleep without the aid of Tylenol PM. This sleep disturbance is associated with next-day irritability and fatigue. She denies sleep apnea but admits to light snoring, which has decreased with recent weight loss.  The patient also complains of itchy ears, which have been a recent issue. She has not identified any triggers for this symptom.   The patient is also on amlodipine for hypertension and takes iron and folic acid supplements. She denies any symptoms of heartburn or indigestion and has not experienced any recent episodes of syncope or dizziness.        Outpatient Medications Prior to Visit  Medication Sig Dispense Refill   folic acid (FOLVITE) 1 MG tablet Take 1 tablet (1 mg total) by mouth daily. 90 tablet 1   pantoprazole (PROTONIX) 20 MG tablet Take 20 mg by mouth 2 (two) times daily.     SV VITAMIN B-12 ER 1000 MCG TBCR Take 1 tablet by mouth daily.     amLODipine (NORVASC) 5 MG tablet Take 1 tablet (5 mg total) by mouth daily. 90 tablet 0   Ferric Maltol (ACCRUFER) 30 MG CAPS Take 1 capsule (30 mg total) by mouth in the morning and at bedtime. 180 capsule 1   No facility-administered medications prior to visit.    ROS Review of Systems  Constitutional: Negative.  Negative for appetite change, chills, diaphoresis and fatigue.  HENT: Negative.    Respiratory:  Negative for apnea, chest tightness, shortness  of breath and wheezing.   Cardiovascular:  Negative for chest pain, palpitations and leg swelling.  Gastrointestinal:  Negative for abdominal pain, blood in stool, constipation, diarrhea, nausea and vomiting.  Genitourinary: Negative.  Negative for difficulty urinating.  Musculoskeletal: Negative.  Negative for arthralgias and myalgias.  Skin: Negative.   Neurological: Negative.  Negative for dizziness and weakness.  Hematological:  Negative for adenopathy. Does not bruise/bleed easily.  Psychiatric/Behavioral:  Positive for sleep disturbance. Negative for behavioral problems, confusion, decreased concentration, dysphoric mood and suicidal ideas. The patient is not nervous/anxious.     Objective:  BP 128/84 (BP Location: Left Arm, Patient Position: Sitting, Cuff Size: Large)   Pulse 90   Temp 98.2 F (36.8 C) (Oral)   Resp 16   Ht 5\' 1"  (1.549 m)   Wt 219 lb (99.3 kg)   LMP 04/30/2023   SpO2 99%   BMI 41.38 kg/m   BP Readings from Last 3 Encounters:  06/10/23 128/84  06/18/22 124/76  03/15/22 (!) 144/92    Wt Readings from Last 3 Encounters:  06/10/23 219 lb (99.3 kg)  06/18/22 223 lb (101.2 kg)  03/15/22 231 lb (104.8 kg)    Physical Exam Vitals reviewed.  Constitutional:      Appearance: Normal appearance.  HENT:     Right Ear: Hearing, tympanic membrane, ear canal and external ear normal. There is no impacted cerumen.     Left Ear:  Hearing, tympanic membrane, ear canal and external ear normal. There is no impacted cerumen.     Ears:     Comments: There is mild scaling in both EACs.    Mouth/Throat:     Mouth: Mucous membranes are moist.  Eyes:     General: No scleral icterus.    Conjunctiva/sclera: Conjunctivae normal.  Cardiovascular:     Rate and Rhythm: Normal rate and regular rhythm.     Heart sounds: Normal heart sounds, S1 normal and S2 normal. No murmur heard.    No gallop.     Comments: EKG- NSR, 96 bpm +LVH No Q waves. Unchanged.  Pulmonary:      Effort: Pulmonary effort is normal.     Breath sounds: No stridor. No wheezing, rhonchi or rales.  Abdominal:     General: Abdomen is flat.     Palpations: There is no mass.     Tenderness: There is no abdominal tenderness. There is no guarding.     Hernia: No hernia is present.  Musculoskeletal:     Cervical back: Neck supple.     Right lower leg: No edema.     Left lower leg: No edema.  Lymphadenopathy:     Cervical: No cervical adenopathy.  Skin:    General: Skin is warm and dry.  Neurological:     General: No focal deficit present.     Mental Status: She is alert. Mental status is at baseline.  Psychiatric:        Mood and Affect: Mood normal.        Behavior: Behavior normal.     Lab Results  Component Value Date   WBC 9.3 06/10/2023   HGB 11.2 (L) 06/10/2023   HCT 35.9 (L) 06/10/2023   PLT 435.0 (H) 06/10/2023   GLUCOSE 53 (L) 06/10/2023   ALT 21 06/10/2023   AST 24 06/10/2023   NA 141 06/10/2023   K 4.0 06/10/2023   CL 102 06/10/2023   CREATININE 0.77 06/10/2023   BUN 10 06/10/2023   CO2 19 06/10/2023   TSH 1.33 06/10/2023   HGBA1C 6.0 06/10/2023    MR Lumbar Spine Wo Contrast  Result Date: 07/03/2022 CLINICAL DATA:  Initial evaluation for low back pain with radiation into the right lower extremity. EXAM: MRI LUMBAR SPINE WITHOUT CONTRAST TECHNIQUE: Multiplanar, multisequence MR imaging of the lumbar spine was performed. No intravenous contrast was administered. COMPARISON:  Radiograph from 06/18/2022. FINDINGS: Segmentation: Standard. Lowest well-formed disc space labeled the L5-S1 level. Alignment: Physiologic with preservation of the normal lumbar lordosis. No listhesis. Vertebrae: Vertebral body height maintained without acute or chronic fracture. Bone marrow signal intensity diffusely decreased on T1 weighted sequence, nonspecific, but most commonly related to anemia, smoking or obesity. No discrete or worrisome osseous lesions. No abnormal marrow edema.  Conus medullaris and cauda equina: Conus extends to the T12-L1 level. Conus and cauda equina appear normal. Paraspinal and other soft tissues: Unremarkable. Disc levels: T10-11: Seen only on sagittal projection. Small right paracentral disc protrusion indents the ventral thecal sac. Mild facet hypertrophy. No significant canal or foraminal stenosis. T11-12: Seen only on sagittal projection. Small right paracentral disc protrusion indents the ventral thecal sac. Mild facet hypertrophy. No significant canal or foraminal stenosis. T12-L1: Unremarkable. L1-2: Negative interspace. Mild facet hypertrophy. No canal or foraminal stenosis. L2-3: Negative interspace. Mild facet hypertrophy. No canal or foraminal stenosis. L3-4: Negative interspace. Mild to moderate right greater than left facet hypertrophy. No significant spinal stenosis. Foramina remain patent.  L4-5: Negative interspace. Mild to moderate bilateral facet hypertrophy, greater on the right. No significant spinal stenosis. Foramina remain patent. L5-S1: Right subarticular disc protrusion contacts and mildly displaces the descending right S1 nerve root as it courses through the right lateral recess (series 6, image 32). Mild to moderate facet hypertrophy. No significant spinal stenosis. Mild bilateral L5 foraminal narrowing. IMPRESSION: 1. Right subarticular disc protrusion at L5-S1, contacting and mildly displacing the descending right S1 nerve root. 2. Mild to moderate facet hypertrophy at L2-3 through L5-S1, which could contribute to underlying back pain. 3. Small right paracentral disc protrusions at T10-11 and T11-12 without significant stenosis. Electronically Signed   By: Rise Mu M.D.   On: 07/03/2022 03:49    Assessment & Plan:   Primary hypertension- Her blood pressure is adequately well-controlled. -     Basic metabolic panel; Future -     CBC with Differential/Platelet; Future -     Hepatic function panel; Future -     Urinalysis,  Routine w reflex microscopic; Future -     EKG 12-Lead -     amLODIPine Besylate; Take 1 tablet (5 mg total) by mouth daily.  Dispense: 90 tablet; Refill: 1  Iron deficiency anemia due to chronic blood loss- Will restart oral iron and will refer for iron infusions. -     IBC + Ferritin; Future -     CBC with Differential/Platelet; Future -     ACCRUFeR; Take 1 capsule (30 mg total) by mouth in the morning and at bedtime.  Dispense: 180 capsule; Refill: 1 -     Ambulatory referral to Hematology / Oncology  Obesity, morbid, BMI 40.0-49.9 (HCC) -     Basic metabolic panel; Future -     Hemoglobin A1c; Future -     Hepatic function panel; Future -     TSH; Future -     Zepbound; Inject 2.5 mg into the skin once a week.  Dispense: 4 mL; Refill: 0  LVH (left ventricular hypertrophy) due to hypertensive disease, without heart failure -     Urinalysis, Routine w reflex microscopic; Future -     amLODIPine Besylate; Take 1 tablet (5 mg total) by mouth daily.  Dispense: 90 tablet; Refill: 1  Chronic eczematous otitis externa of both ears -     Neomycin-Polymyxin-HC; Place 3 drops into both ears 3 (three) times daily.  Dispense: 10 mL; Refill: 1  Encounter for general adult medical examination with abnormal findings - Exam completed, labs reviewed, vaccines reviewed, cancer screenings addressed, pt ed material was given.   Cervical cancer screening -     Ambulatory referral to Gynecology  Psychophysiological insomnia -     Eszopiclone; Take 1 tablet (3 mg total) by mouth at bedtime. Take immediately before bedtime  Dispense: 90 tablet; Refill: 1  Need for hepatitis C screening test -     Hepatitis C antibody; Future  Elevated total protein -     Ambulatory referral to Hematology / Oncology     Follow-up: Return in about 6 months (around 12/08/2023).  Sanda Linger, MD

## 2023-06-10 NOTE — Patient Instructions (Signed)

## 2023-06-11 ENCOUNTER — Encounter: Payer: Self-pay | Admitting: Internal Medicine

## 2023-06-11 DIAGNOSIS — R778 Other specified abnormalities of plasma proteins: Secondary | ICD-10-CM | POA: Insufficient documentation

## 2023-06-11 DIAGNOSIS — Z1159 Encounter for screening for other viral diseases: Secondary | ICD-10-CM | POA: Insufficient documentation

## 2023-06-11 DIAGNOSIS — F5104 Psychophysiologic insomnia: Secondary | ICD-10-CM | POA: Insufficient documentation

## 2023-06-11 DIAGNOSIS — G4709 Other insomnia: Secondary | ICD-10-CM | POA: Insufficient documentation

## 2023-06-11 LAB — IBC + FERRITIN
Ferritin: 11 ng/mL (ref 10.0–291.0)
Iron: 35 ug/dL — ABNORMAL LOW (ref 42–145)
Saturation Ratios: 6.7 % — ABNORMAL LOW (ref 20.0–50.0)
TIBC: 519.4 ug/dL — ABNORMAL HIGH (ref 250.0–450.0)
Transferrin: 371 mg/dL — ABNORMAL HIGH (ref 212.0–360.0)

## 2023-06-11 LAB — HEPATIC FUNCTION PANEL
ALT: 21 U/L (ref 0–35)
AST: 24 U/L (ref 0–37)
Albumin: 4.6 g/dL (ref 3.5–5.2)
Alkaline Phosphatase: 111 U/L (ref 39–117)
Bilirubin, Direct: 0.1 mg/dL (ref 0.0–0.3)
Total Bilirubin: 0.3 mg/dL (ref 0.2–1.2)
Total Protein: 8.5 g/dL — ABNORMAL HIGH (ref 6.0–8.3)

## 2023-06-11 LAB — BASIC METABOLIC PANEL
BUN: 10 mg/dL (ref 6–23)
CO2: 19 mEq/L (ref 19–32)
Calcium: 10.2 mg/dL (ref 8.4–10.5)
Chloride: 102 mEq/L (ref 96–112)
Creatinine, Ser: 0.77 mg/dL (ref 0.40–1.20)
GFR: 96.92 mL/min (ref >60.00)
Glucose, Bld: 53 mg/dL — ABNORMAL LOW (ref 70–99)
Potassium: 4 mEq/L (ref 3.5–5.1)
Sodium: 141 mEq/L (ref 135–145)

## 2023-06-11 LAB — HEPATITIS C ANTIBODY: Hepatitis C Ab: NONREACTIVE

## 2023-06-11 MED ORDER — ZEPBOUND 2.5 MG/0.5ML ~~LOC~~ SOAJ: 2.5000 mg | SUBCUTANEOUS | 0 refills | Status: DC

## 2023-06-11 MED ORDER — AMLODIPINE BESYLATE 5 MG PO TABS
5.0000 mg | ORAL_TABLET | Freq: Every day | ORAL | 1 refills | Status: DC
Start: 2023-06-11 — End: 2023-11-19

## 2023-06-11 MED ORDER — ACCRUFER 30 MG PO CAPS
1.0000 | ORAL_CAPSULE | Freq: Two times a day (BID) | ORAL | 1 refills | Status: DC
Start: 2023-06-11 — End: 2023-07-25

## 2023-06-13 ENCOUNTER — Telehealth: Payer: Self-pay

## 2023-06-13 ENCOUNTER — Other Ambulatory Visit (HOSPITAL_COMMUNITY): Payer: Self-pay

## 2023-06-13 NOTE — Telephone Encounter (Addendum)
Pharmacy Patient Advocate Encounter   Received notification from Physician's Office that prior authorization for ZEPBOUND is required/requested.   Insurance verification completed.   The patient is insured through  [employer]  .   Per test claim: PA required; PA submitted to [employer] [facility] via [employer] Key/confirmation #/EOC (Key: [id number]) Status is pending

## 2023-06-17 ENCOUNTER — Other Ambulatory Visit (HOSPITAL_COMMUNITY): Payer: Self-pay

## 2023-06-17 NOTE — Telephone Encounter (Signed)
Pharmacy Patient Advocate Encounter  Received notification from  [facility]  that Prior Authorization for [name] has been APPROVED from 9.27.24 to 3.26.25. Ran test claim, The Rx has last been filled on 06/15/23 and is not yet payable again on/after 07/06/23.     This test claim was processed through [facility]- copay amounts may vary at other pharmacies due to pharmacy/plan contracts, or as the patient moves through the different stages of their insurance plan.   PA #/Case ID/Reference #: Key: [id number]

## 2023-06-18 ENCOUNTER — Telehealth: Payer: Self-pay | Admitting: Internal Medicine

## 2023-06-18 NOTE — Telephone Encounter (Signed)
Pt called stating the pharmacy need Prior Authorization for this particular medication . Please advise.  Eszopiclone 3 MG TABS

## 2023-06-20 ENCOUNTER — Telehealth: Payer: Self-pay

## 2023-06-20 ENCOUNTER — Other Ambulatory Visit (HOSPITAL_COMMUNITY): Payer: Self-pay

## 2023-06-20 NOTE — Telephone Encounter (Signed)
Pharmacy Patient Advocate Encounter   Received notification from Patient Advice Request messages that prior authorization for Eszopiclone 3MG  tablets is required/requested.   Insurance verification completed.   The patient is insured through [facility] .   Per test claim: PA required; PA submitted to [facility] via [employer] Key/confirmation #/EOC [id number] Status is pending

## 2023-06-20 NOTE — Telephone Encounter (Signed)
Pharmacy Patient Advocate Encounter  Received notification from [facility] that Prior Authorization for Eszopiclone 3MG  tablets  has been APPROVED from 06/20/23 to 12/17/23   PA #/Case ID/Reference #: [id number]

## 2023-06-21 NOTE — Telephone Encounter (Signed)
PA request has been Approved. New Encounter created for follow up. For additional info see Pharmacy Prior Auth telephone encounter from 06/20/23.

## 2023-06-27 ENCOUNTER — Encounter: Payer: BC Managed Care – PPO | Admitting: Family Medicine

## 2023-07-01 ENCOUNTER — Telehealth: Payer: Self-pay | Admitting: Internal Medicine

## 2023-07-01 ENCOUNTER — Inpatient Hospital Stay: Payer: BC Managed Care – PPO

## 2023-07-01 ENCOUNTER — Inpatient Hospital Stay: Payer: BC Managed Care – PPO | Admitting: Nurse Practitioner

## 2023-07-01 ENCOUNTER — Other Ambulatory Visit: Payer: Self-pay | Admitting: Internal Medicine

## 2023-07-01 MED ORDER — ZEPBOUND 5 MG/0.5ML ~~LOC~~ SOAJ
5.0000 mg | SUBCUTANEOUS | 0 refills | Status: DC
Start: 2023-07-01 — End: 2023-07-24

## 2023-07-01 NOTE — Telephone Encounter (Signed)
Reschedule appointment per 10/14 secure chat. Patient is aware the changes made to her future appointments.

## 2023-07-01 NOTE — Telephone Encounter (Signed)
Prescription Request  07/01/2023  LOV: 06/10/2023  What is the name of the medication or equipment?  tirzepatide (ZEPBOUND) 2.5 MG/0.5ML Pen   Have you contacted your pharmacy to request a refill? Yes   Which pharmacy would you like this sent to?  [facility] [address] [place], [place] - [address] [place] [address] Phone: [phone] Fax: [phone]    Patient notified that their request is being sent to the clinical staff for review and that they should receive a response within 2 business days.   Please advise at Mobile [phone] (mobile)

## 2023-07-08 IMAGING — CT CT RENAL STONE PROTOCOL
2 of 4 series · 17 of 46 positions shown, 19 images · non-contrast
Comparison: 08/25/2016

CLINICAL DATA: Left-sided flank pain for several days, initial
encounter

EXAM:
CT ABDOMEN AND PELVIS WITHOUT CONTRAST
TECHNIQUE: Multidetector CT imaging of the abdomen and pelvis was performed
following the standard protocol without IV contrast.

[Series 2: axial st · axial · 0.91mm/px · z∈[-304,+116]mm · 14 of 92 slices shown, 16 images]
[im 4/92  soft-tissue]
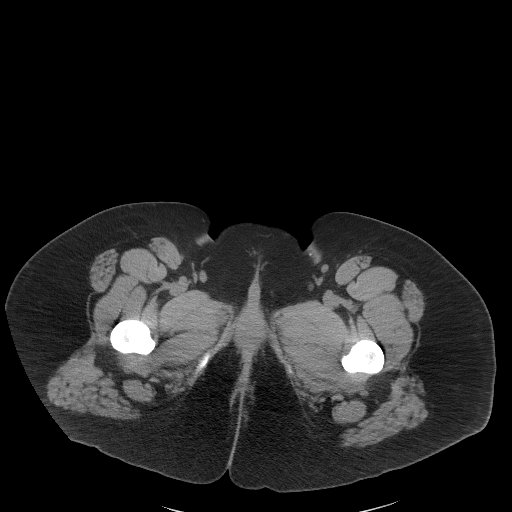
[im 4/92  bone]
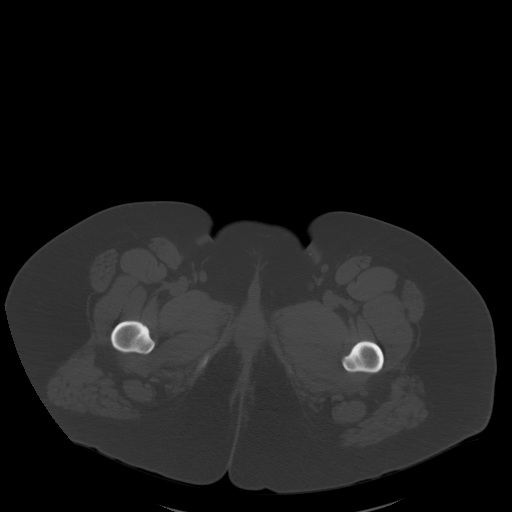
[im 12/92  soft-tissue]
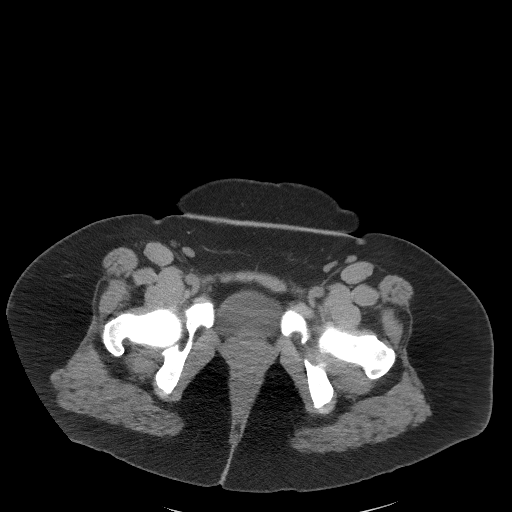
[im 19/92  soft-tissue]
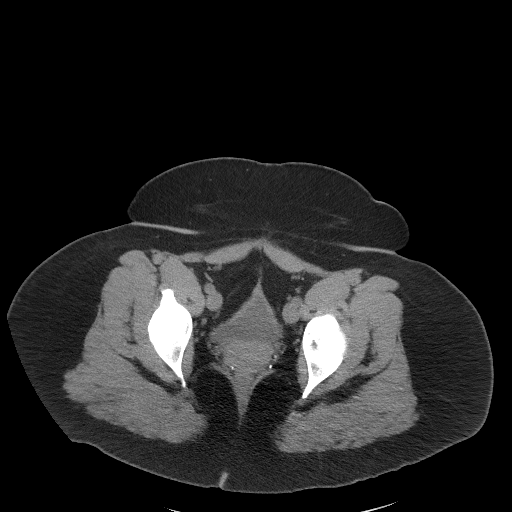
[im 23/92  soft-tissue]
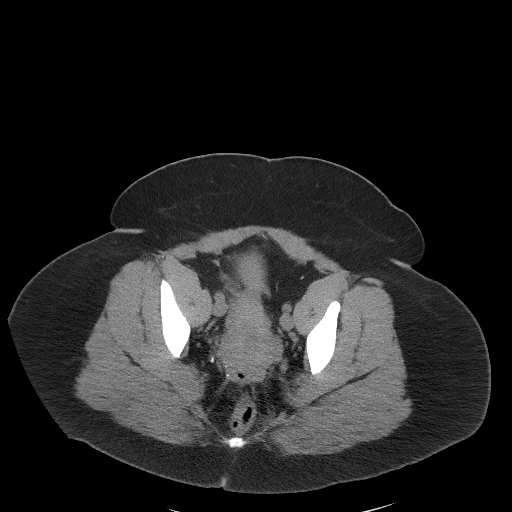
[im 31/92  soft-tissue]
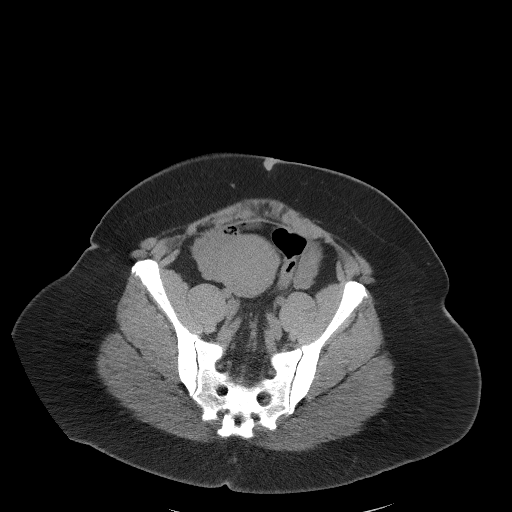
[im 38/92  soft-tissue]
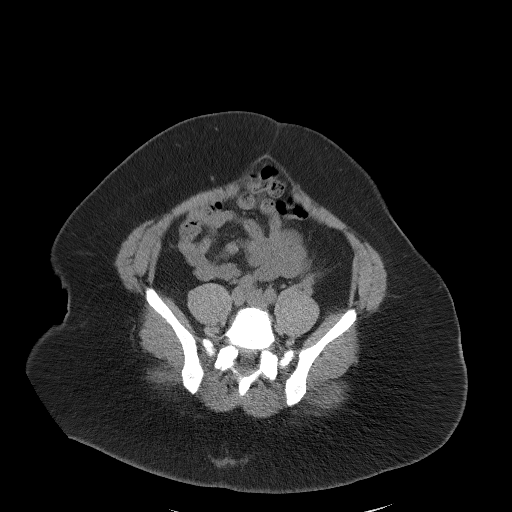
[im 42/92  soft-tissue]
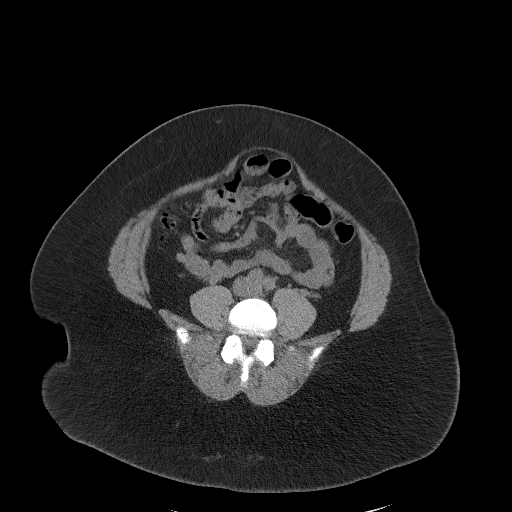
[im 50/92  soft-tissue]
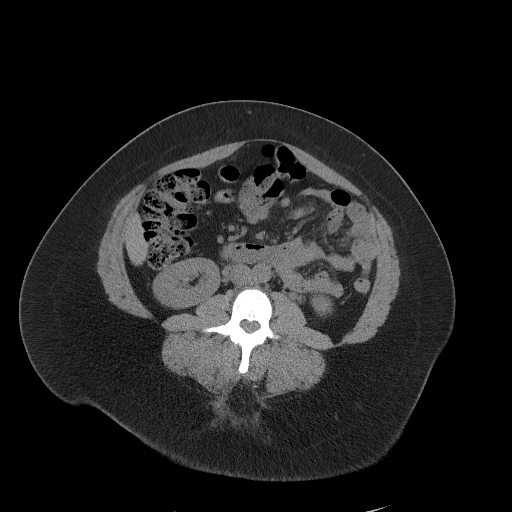
[im 54/92  soft-tissue]
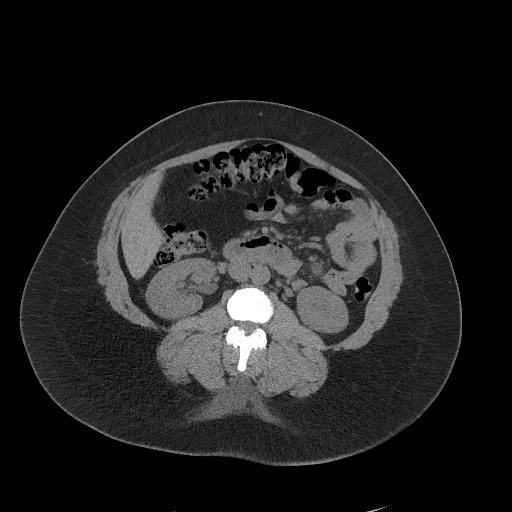
[im 54/92  bone]
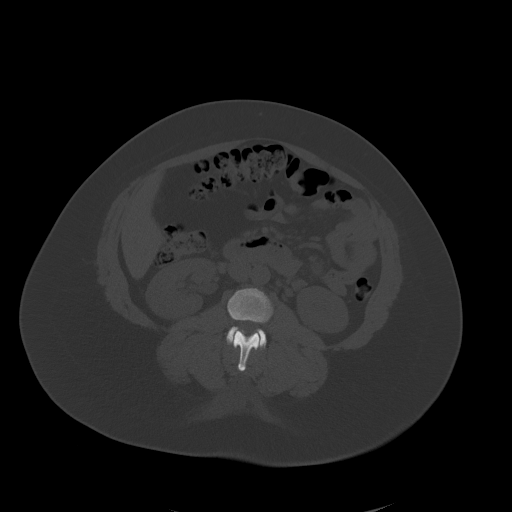
[im 61/92  soft-tissue]
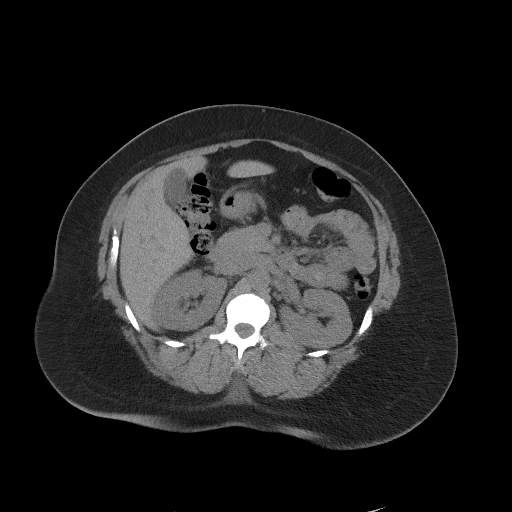
[im 69/92  soft-tissue]
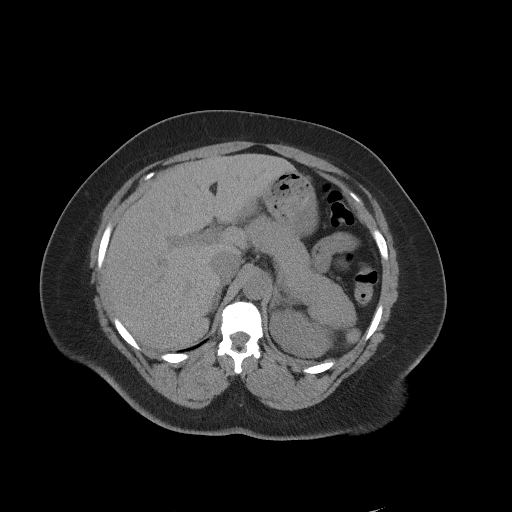
[im 73/92  soft-tissue]
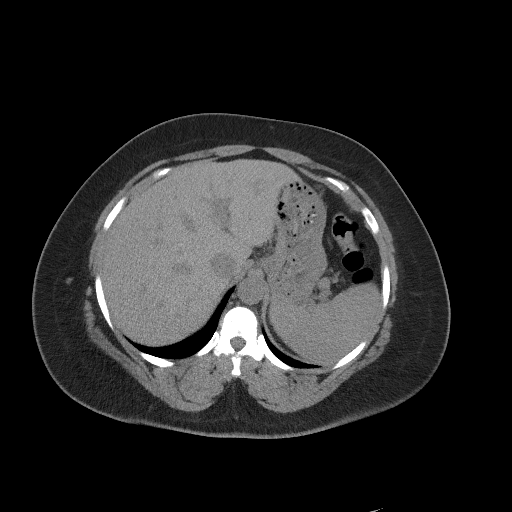
[im 80/92  soft-tissue]
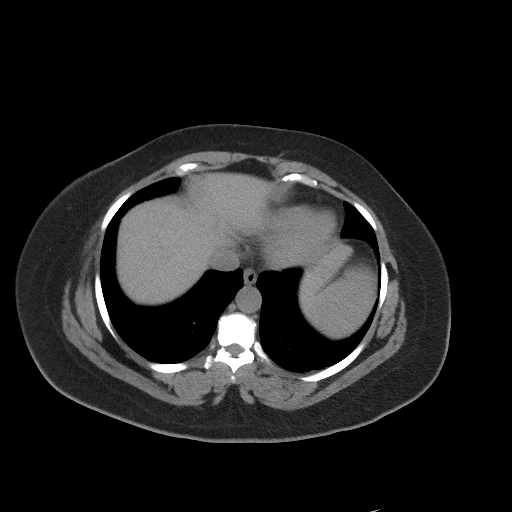
[im 88/92  soft-tissue]
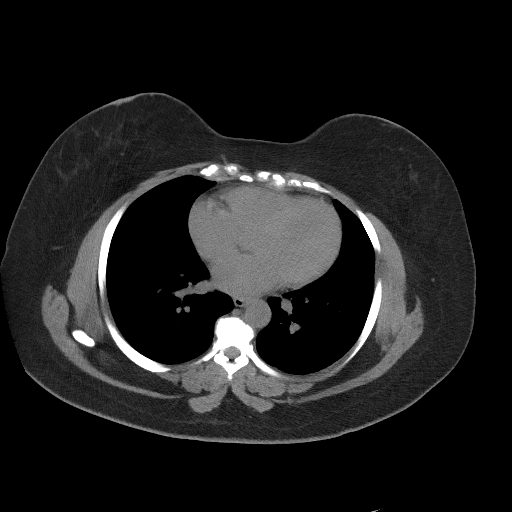

[Series 5: coronal st · coronal · 0.83mm/px · 3 of 115 slices shown]
[im 39/115  soft-tissue]
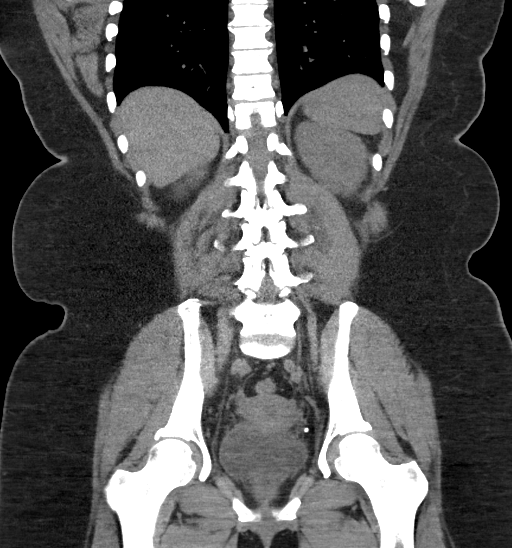
[im 51/115  soft-tissue]
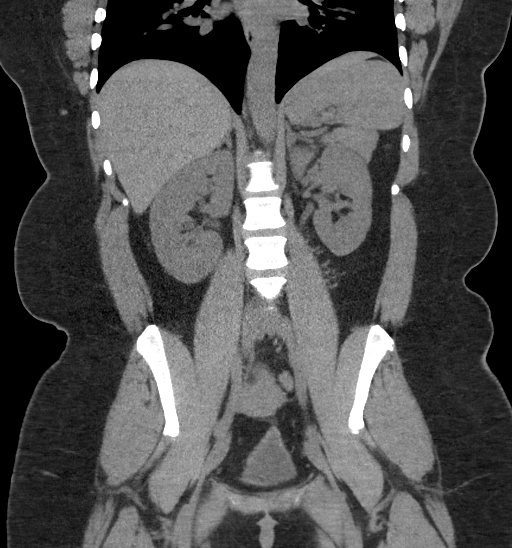
[im 64/115  soft-tissue]
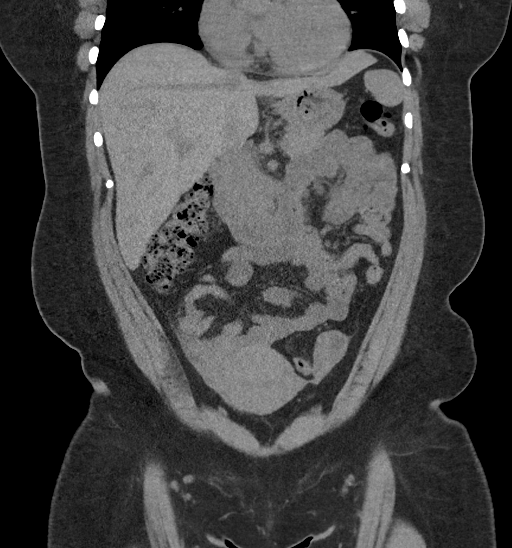

[17 of 46 positions shown; findings below may reference images not displayed]

FINDINGS: Lower chest: No acute abnormality.

Hepatobiliary: Single dependent gallstone is noted. No complicating
factors are seen. The liver is within normal limits.

Pancreas: Unremarkable. No pancreatic ductal dilatation or
surrounding inflammatory changes.

Spleen: Normal in size without focal abnormality.

Adrenals/Urinary Tract: Adrenal glands are within normal limits.
Kidneys are well visualized bilaterally. Small 2 mm left renal stone
is noted without obstructive change. No ureteral stones are noted.
The bladder is decompressed.

Stomach/Bowel: Colon shows no obstructive or inflammatory changes.
The appendix is within normal limits. Small bowel and stomach are
unremarkable.

Vascular/Lymphatic: No significant vascular findings are present. No
enlarged abdominal or pelvic lymph nodes.

Reproductive: Uterus and bilateral adnexa are unremarkable.

Other: No abdominal wall hernia or abnormality. No abdominopelvic
ascites.

Musculoskeletal: No acute or significant osseous findings.
IMPRESSION: 2 mm left renal stone without obstructive change.

Cholelithiasis without complicating factors.

No other focal abnormality is noted.

## 2023-07-15 ENCOUNTER — Telehealth: Payer: Self-pay | Admitting: Physician Assistant

## 2023-07-15 NOTE — Telephone Encounter (Signed)
Rescheduled appointments per patients request. Patient is aware of the changes made to her upcoming appointments.

## 2023-07-17 ENCOUNTER — Other Ambulatory Visit: Payer: BC Managed Care – PPO

## 2023-07-17 ENCOUNTER — Encounter: Payer: BC Managed Care – PPO | Admitting: Nurse Practitioner

## 2023-07-22 NOTE — Progress Notes (Signed)
[facility] [phone]   [phone]  CONSULT NOTE  REFERRING PHYSICIAN: Dr. Yetta Barre  REASON FOR CONSULTATION:  Iron deficiency Anemia  HPI Christina Bruce is a 42 y.o. female with a past medical history for hypertension, migraines, allergies, carpal tunnel, depression, sickle cell trait, B12 deficiency, and menometrorrhagia is referred to clinic for IDA.   He saw his PCP on 06/10/23 for a follow up for her chronic conditions. She was noted to be on iron supplements and folic acid. She had been on oral iron for 6 months or so accruefeR. She had repeat iron studies that continued to show persistent iron deficiency with ferritin of 11, iron of 35, TIBC 519.4, and saturation of 6.7.  This did cause some GI upset (dark stools and constipation) so the patient did not always take this daily.  She was referred to the clinic for IV iron infusions.  She stopped taking her iron supplements in September after having lab work drawn showing persistent anemia.  She still continues to take her folic acid.   The patient reports that she has been anemic since her first child was born which was in 2002. She required 2 blood transfusions when she was pregnant with her twins.  The oldest labs available to me are from 11 years ago. She was anemic with a Hbg of 9.7. This improved in 2017. It looks like she started having anemia again somewhere between 2017-2022.    The patient has menstrual cycles monthly.  She is unsure if they are heavy or not.  She states that she uses 7 size 3-4 pads during her menstrual cycles.  She does not know if she has any fibroids.  She mentions that she does spotting between her menstrual cycles.  Denies any significant clotting.  She is scheduled to see a new GYN on 11/21.  She has never had a colonoscopy due to not meeting the screening guidelines and has not had a reason for further diagnostic workup.    Overall, she feels "pretty good".  She has  baseline fatigue due to always being tired and having low energy which is stable.  Denies any dyspnea exertion or lightheadedness.  Denies any visible bleeding or bruising.  She does take Aleve approximately 2-3 times per week.  Denies any blood thinner use.  She denies any particular dietary habits such as being a vegan or vegetarian but she does not eat a lot of red meat secondary to preference as she tries to eat chicken and seafood.  She does crave ice chips.  She does drink a lot of green tea.  She denies any history of bariatric surgery.  She had an aunt who passed away secondary to complications from sickle cell.  Her mother and father both have hypertension and diabetes.  She works as a Financial risk analyst.  She is single.  She has 7 children.  She is a never smoker.  She only drinks wine approximately once every 3 to 4 months.  Denies any drug use.     HPI  Past Medical History:  Diagnosis Date   Anemia    Headache(784.0)    Rh negative, maternal     Past Surgical History:  Procedure Laterality Date   CESAREAN SECTION     TUBAL LIGATION Bilateral     Family History  Problem Relation Age of Onset   Hypertension Mother    Diabetes Mother    Hypertension Father    Thyroid disease Sister  Other Neg Hx     Social History Social History   Tobacco Use   Smoking status: Never    Passive exposure: Never   Smokeless tobacco: Never  Vaping Use   Vaping status: Never Used  Substance Use Topics   Alcohol use: No   Drug use: No    Allergies  Allergen Reactions   Metoprolol Other (See Comments)    Drowsiness    Penicillins Hives    Current Outpatient Medications  Medication Sig Dispense Refill   amLODipine (NORVASC) 5 MG tablet Take 1 tablet (5 mg total) by mouth daily. 90 tablet 1   Eszopiclone 3 MG TABS Take 1 tablet (3 mg total) by mouth at bedtime. Take immediately before bedtime 90 tablet 1   FeFum-FePoly-FA-B Cmp-C-Biot (INTEGRA PLUS) CAPS Take 1 capsule by mouth every  morning. 30 capsule 2   folic acid (FOLVITE) 1 MG tablet Take 1 tablet (1 mg total) by mouth daily. 90 tablet 1   neomycin-polymyxin-hydrocortisone (CORTISPORIN) OTIC solution Place 3 drops into both ears 3 (three) times daily. 10 mL 1   ZEPBOUND 5 MG/0.5ML Pen INJECT 5MG  INTO THE SKIN ONCE A WEEK 4 mL 0   pantoprazole (PROTONIX) 20 MG tablet Take 20 mg by mouth 2 (two) times daily. (Patient not taking: Reported on 07/25/2023)     SV VITAMIN B-12 ER 1000 MCG TBCR Take 1 tablet by mouth daily. (Patient not taking: Reported on 07/25/2023)     No current facility-administered medications for this visit.    REVIEW OF SYSTEMS:   Review of Systems  Constitutional: Positive for fatigue.  Negative for appetite change, chills, fever and unexpected weight change.  HENT: Negative for mouth sores, nosebleeds, sore throat and trouble swallowing.   Eyes: Negative for eye problems and icterus.  Respiratory: Negative for cough, hemoptysis, shortness of breath and wheezing.   Cardiovascular: Negative for chest pain and leg swelling.  Gastrointestinal: Negative for abdominal pain, constipation, diarrhea, nausea and vomiting.  Genitourinary: Negative for bladder incontinence, difficulty urinating, dysuria, frequency and hematuria.   Musculoskeletal: Negative for back pain, gait problem, neck pain and neck stiffness.  Skin: Negative for itching and rash.  Neurological: Negative for dizziness, extremity weakness, gait problem, headaches, light-headedness and seizures.  Hematological: Negative for adenopathy. Does not bruise/bleed easily.  Psychiatric/Behavioral: Negative for confusion, depression and sleep disturbance. The patient is not nervous/anxious.     PHYSICAL EXAMINATION:  Blood pressure (!) 136/94, pulse 79, temperature 98.5 F (36.9 C), temperature source Oral, resp. rate 16, weight 201 lb 12.8 oz (91.5 kg), SpO2 98%.  ECOG PERFORMANCE STATUS: 1  Physical Exam  Constitutional: Oriented to person,  place, and time and well-developed, well-nourished, and in no distress. HENT:  Head: Normocephalic and atraumatic.  Mouth/Throat: Oropharynx is clear and moist. No oropharyngeal exudate.  Eyes: Conjunctivae are normal. Right eye exhibits no discharge. Left eye exhibits no discharge. No scleral icterus.  Neck: Normal range of motion. Neck supple.  Cardiovascular: Normal rate, regular rhythm, normal heart sounds and intact distal pulses.   Pulmonary/Chest: Effort normal and breath sounds normal. No respiratory distress. No wheezes. No rales.  Abdominal: Soft. Bowel sounds are normal. Exhibits no distension and no mass. There is no tenderness.  Musculoskeletal: Normal range of motion. Exhibits no edema.  Lymphadenopathy:    No cervical adenopathy.  Neurological: Alert and oriented to person, place, and time. Exhibits normal muscle tone. Gait normal. Coordination normal.  Skin: Skin is warm and dry. No rash noted. Not diaphoretic.  No erythema. No pallor.  Psychiatric: Mood, memory and judgment normal.  Vitals reviewed.  LABORATORY DATA: Lab Results  Component Value Date   WBC 9.3 06/10/2023   HGB 11.2 (L) 06/10/2023   HCT 35.9 (L) 06/10/2023   MCV 71.5 (L) 06/10/2023   PLT 435.0 (H) 06/10/2023      Chemistry      Component Value Date/Time   NA 141 06/10/2023 1549   K 4.0 06/10/2023 1549   CL 102 06/10/2023 1549   CO2 19 06/10/2023 1549   BUN 10 06/10/2023 1549   CREATININE 0.77 06/10/2023 1549      Component Value Date/Time   CALCIUM 10.2 06/10/2023 1549   ALKPHOS 111 06/10/2023 1549   AST 24 06/10/2023 1549   ALT 21 06/10/2023 1549   BILITOT 0.3 06/10/2023 1549       RADIOGRAPHIC STUDIES: No results found.  ASSESSMENT: This is a very pleasant 42 year old female referred to the clinic for iron deficiency anemia.  She also has sickle cell trait.   The patient had several lab studies today including CBC, CMP, iron studies, ferritin, sample blood bank, folate, and B12.    Her labs from today demonstrate a normal CBC with a hemoglobin of 12.0.  Her MCV is lowat 71.4.  Her CMP is unremarkable except her protein is slightly elevated.      The patient was seen with Dr. Arbutus Ped today.  Iron studies, B12, folate, and ferritin were not back at the time of her appointment.  The patient has been off her iron supplement for about 1.5 months.  Dr. Arbutus Ped discussed that the patient can trial a different prescription iron that is likely more tolerable daily and reevaluating in 2 months to see if she needs IV iron at that time.  The appointment ended with her being seen for follow-up in 2 months with repeat labs.  I told the patient I will call her tomorrow with results.  Since being seen her iron studies do show worsening iron, I may discuss the options of IV iron with the patient vs being compliant with her oral iron and rechecking in 2 months.   Of note I did check with her insurance company preferred as Venofer or Feraheme.  Regardless if she proceeds with IV iron or not, recommend that she take an oral iron supplement 1 tablet p.o. daily.  I sent her prescription for Integra plus.  She was instructed to take her iron supplement and vitamin C.  I let her know that drinking milk or tea with iron may inhibit absorption.  Also discussed increasing her dietary intake of iron rich food.    Recommend that she follow-up with GYN regarding her heavy menstrual cycles. The patient voices understanding of current disease status and treatment options and is in agreement with the current care plan.  The patient voices understanding of current disease status and treatment options and is in agreement with the current care plan.  All questions were answered. The patient knows to call the clinic with any problems, questions or concerns. We can certainly see the patient much sooner if necessary.  Thank you so much for allowing me to participate in the care of Christina Bruce. I will  continue to follow up the patient with you and assist in her care.  Disclaimer: This note was dictated with voice recognition software. Similar sounding words can inadvertently be transcribed and may not be corrected upon review.   Christina Bruce L Aubree Doody July 25, 2023, 2:22  PM  ADDENDUM: Hematology/Oncology Attending:  I had a face-to-face encounter with the patient today.  I reviewed her record, lab and recommended her care plan.  This is a very pleasant 42 years old white female with history of sickle cell trait, hypertension, migraine, carpal tunnel syndrome as well as vitamin B12 deficiency and menorrhagia.  The patient was seen recently by her primary care provider for routine evaluation and monitoring of her condition.  She was noted to have persistent mild iron deficiency anemia with ferritin level of 11 and serum iron 35 with TIBC of 519.4 as well as iron saturation of 6.7%.  The patient has been on oral iron tablet for around 6 months in addition to folic acid. Her anemia has been going on for several years since 2002.  She has some intolerance issue to the over-the-counter iron supplements. Repeat CBC today showed hemoglobin of 12.0 and hematocrit 37.2 with MCV of 71.4.  Her platelets count were slightly elevated at 404,000.  Iron study showed low serum iron of 16 with iron saturation of 3% and TIBC of 472.  Ferritin level was 10.  She has normal vitamin B12 and serum folate. I had a lengthy discussion with the patient today about her current condition and treatment options.  The patient was given the option of treatment with oral iron supplement with a prescription of Integra +1 capsule p.o. daily with vitamin C or orange juice versus consideration of iron infusion.  She continues to have iron deficiency but her hemoglobin is 12 today which is within the normal range.  She was also encouraged to increase the iron rich diet.  She would like to try the oral iron option for now and if no  improvement, definitely will consider her for iron infusion. The patient will come back for follow-up visit in 2 months for evaluation and repeat CBC, iron study and ferritin. She was advised to call immediately if she has any other concerning symptoms in the interval. The total time spent in the appointment was 60 minutes. Disclaimer: This note was dictated with voice recognition software. Similar sounding words can inadvertently be transcribed and may be missed upon review. Lajuana Matte, MD

## 2023-07-24 ENCOUNTER — Other Ambulatory Visit: Payer: Self-pay | Admitting: Physician Assistant

## 2023-07-24 ENCOUNTER — Other Ambulatory Visit: Payer: Self-pay | Admitting: Internal Medicine

## 2023-07-24 DIAGNOSIS — D500 Iron deficiency anemia secondary to blood loss (chronic): Secondary | ICD-10-CM

## 2023-07-25 ENCOUNTER — Inpatient Hospital Stay: Payer: BC Managed Care – PPO

## 2023-07-25 ENCOUNTER — Inpatient Hospital Stay: Payer: BC Managed Care – PPO | Attending: Physician Assistant | Admitting: Physician Assistant

## 2023-07-25 VITALS — BP 136/94 | HR 79 | Temp 98.5°F | Resp 16 | Wt 201.8 lb

## 2023-07-25 DIAGNOSIS — Z833 Family history of diabetes mellitus: Secondary | ICD-10-CM | POA: Diagnosis not present

## 2023-07-25 DIAGNOSIS — N920 Excessive and frequent menstruation with regular cycle: Secondary | ICD-10-CM | POA: Insufficient documentation

## 2023-07-25 DIAGNOSIS — D500 Iron deficiency anemia secondary to blood loss (chronic): Secondary | ICD-10-CM

## 2023-07-25 DIAGNOSIS — D573 Sickle-cell trait: Secondary | ICD-10-CM | POA: Insufficient documentation

## 2023-07-25 DIAGNOSIS — D509 Iron deficiency anemia, unspecified: Secondary | ICD-10-CM | POA: Insufficient documentation

## 2023-07-25 DIAGNOSIS — R5383 Other fatigue: Secondary | ICD-10-CM | POA: Insufficient documentation

## 2023-07-25 DIAGNOSIS — Z8249 Family history of ischemic heart disease and other diseases of the circulatory system: Secondary | ICD-10-CM | POA: Insufficient documentation

## 2023-07-25 LAB — IRON AND IRON BINDING CAPACITY (CC-WL,HP ONLY)
Iron: 16 ug/dL — ABNORMAL LOW (ref 28–170)
Saturation Ratios: 3 % — ABNORMAL LOW (ref 10.4–31.8)
TIBC: 472 ug/dL — ABNORMAL HIGH (ref 250–450)
UIBC: 456 ug/dL — ABNORMAL HIGH (ref 148–442)

## 2023-07-25 LAB — CMP (CANCER CENTER ONLY)
ALT: 21 U/L (ref 0–44)
AST: 22 U/L (ref 15–41)
Albumin: 4.6 g/dL (ref 3.5–5.0)
Alkaline Phosphatase: 68 U/L (ref 38–126)
Anion gap: 8 (ref 5–15)
BUN: 10 mg/dL (ref 6–20)
CO2: 28 mmol/L (ref 22–32)
Calcium: 9.8 mg/dL (ref 8.9–10.3)
Chloride: 103 mmol/L (ref 98–111)
Creatinine: 0.68 mg/dL (ref 0.44–1.00)
GFR, Estimated: >60 mL/min (ref >60)
Glucose, Bld: 87 mg/dL (ref 70–99)
Potassium: 3.5 mmol/L (ref 3.5–5.1)
Sodium: 139 mmol/L (ref 135–145)
Total Bilirubin: 0.4 mg/dL (ref <1.2)
Total Protein: 8.3 g/dL — ABNORMAL HIGH (ref 6.5–8.1)

## 2023-07-25 LAB — CBC WITH DIFFERENTIAL (CANCER CENTER ONLY)
Abs Immature Granulocytes: 0.02 10*3/uL (ref 0.00–0.07)
Basophils Absolute: 0 10*3/uL (ref 0.0–0.1)
Basophils Relative: 1 %
Eosinophils Absolute: 0 10*3/uL (ref 0.0–0.5)
Eosinophils Relative: 1 %
HCT: 37.2 % (ref 36.0–46.0)
Hemoglobin: 12 g/dL (ref 12.0–15.0)
Immature Granulocytes: 0 %
Lymphocytes Relative: 39 %
Lymphs Abs: 2.4 10*3/uL (ref 0.7–4.0)
MCH: 23 pg — ABNORMAL LOW (ref 26.0–34.0)
MCHC: 32.3 g/dL (ref 30.0–36.0)
MCV: 71.4 fL — ABNORMAL LOW (ref 80.0–100.0)
Monocytes Absolute: 0.6 10*3/uL (ref 0.1–1.0)
Monocytes Relative: 9 %
Neutro Abs: 3.1 10*3/uL (ref 1.7–7.7)
Neutrophils Relative %: 50 %
Platelet Count: 404 10*3/uL — ABNORMAL HIGH (ref 150–400)
RBC: 5.21 MIL/uL — ABNORMAL HIGH (ref 3.87–5.11)
RDW: 16.9 % — ABNORMAL HIGH (ref 11.5–15.5)
WBC Count: 6.1 10*3/uL (ref 4.0–10.5)
nRBC: 0 % (ref 0.0–0.2)

## 2023-07-25 LAB — SAMPLE TO BLOOD BANK

## 2023-07-25 LAB — FOLATE: Folate: 19.3 ng/mL (ref >5.9)

## 2023-07-25 LAB — FERRITIN: Ferritin: 10 ng/mL — ABNORMAL LOW (ref 11–307)

## 2023-07-25 LAB — VITAMIN B12: Vitamin B-12: 675 pg/mL (ref 180–914)

## 2023-07-25 MED ORDER — INTEGRA PLUS PO CAPS: 1.0000 | ORAL_CAPSULE | Freq: Every morning | ORAL | 2 refills | Status: DC

## 2023-07-26 ENCOUNTER — Other Ambulatory Visit: Payer: Self-pay | Admitting: Physician Assistant

## 2023-07-26 ENCOUNTER — Telehealth: Payer: Self-pay | Admitting: Physician Assistant

## 2023-07-26 NOTE — Telephone Encounter (Signed)
I called the patient to review her labs. She is interested in iron infusion since her labs have been low for the last year despite taking iron without significant improvement. I placed orders at [facility].

## 2023-07-26 NOTE — Telephone Encounter (Signed)
Second attempted call to review results from yesterday. No answer

## 2023-07-26 NOTE — Telephone Encounter (Signed)
I attempted to call the patient. Her iron studies remain low and have been low for over 1 year. I attempted to call her about her thoughts on iron infusion given her lab values. I left our call back number and will also try to call her again later.

## 2023-07-27 ENCOUNTER — Encounter: Payer: Self-pay | Admitting: Physician Assistant

## 2023-07-29 ENCOUNTER — Telehealth: Payer: Self-pay

## 2023-07-29 NOTE — Telephone Encounter (Signed)
Christina Bruce, patient will be scheduled as soon as possible.  Auth Submission: NO AUTH NEEDED Site of care: Site of care: [facility] Payer: BCBS Medication & CPT/J Code(s) submitted: Feraheme (ferumoxytol) [id number] Route of submission (phone, fax, portal):  Phone # Fax # Auth type: Buy/Bill PB Units/visits requested: 510mg  x 2 doses Reference number:  Approval from: 07/29/23 to 09/17/23

## 2023-08-07 ENCOUNTER — Ambulatory Visit: Payer: BC Managed Care – PPO

## 2023-08-07 VITALS — BP 118/81 | HR 87 | Temp 98.3°F | Resp 18 | Ht 60.0 in | Wt 198.6 lb

## 2023-08-07 DIAGNOSIS — D509 Iron deficiency anemia, unspecified: Secondary | ICD-10-CM | POA: Diagnosis not present

## 2023-08-07 MED ORDER — ACETAMINOPHEN 325 MG PO TABS
650.0000 mg | ORAL_TABLET | Freq: Once | ORAL | Status: AC
  Administered 2023-08-07: 650 mg via ORAL
  Filled 2023-08-07: qty 2

## 2023-08-07 MED ORDER — SODIUM CHLORIDE 0.9 % IV SOLN
510.0000 mg | Freq: Once | INTRAVENOUS | Status: AC
  Administered 2023-08-07: 510 mg via INTRAVENOUS
  Filled 2023-08-07: qty 17

## 2023-08-07 MED ORDER — DIPHENHYDRAMINE HCL 25 MG PO CAPS
25.0000 mg | ORAL_CAPSULE | Freq: Once | ORAL | Status: AC
  Administered 2023-08-07: 25 mg via ORAL
  Filled 2023-08-07: qty 1

## 2023-08-07 NOTE — Progress Notes (Signed)
Diagnosis: Iron Deficiency Anemia  Provider:  Chilton Greathouse MD  Procedure: IV Infusion  IV Type: Peripheral, IV Location: L Antecubital  Feraheme (Ferumoxytol), Dose: 510 mg  Infusion Start Time: 1552  Infusion Stop Time: 1608  Post Infusion IV Care: Observation period completed and Peripheral IV Discontinued  Discharge: Condition: Good, Destination: Home . AVS Declined  Performed by:  Rico Ala, LPN

## 2023-08-08 ENCOUNTER — Encounter: Payer: Self-pay | Admitting: Physician Assistant

## 2023-08-08 ENCOUNTER — Encounter: Payer: BC Managed Care – PPO | Admitting: Nurse Practitioner

## 2023-08-14 ENCOUNTER — Ambulatory Visit: Payer: BC Managed Care – PPO

## 2023-08-14 MED ORDER — DIPHENHYDRAMINE HCL 25 MG PO CAPS: 25.0000 mg | ORAL_CAPSULE | Freq: Once | ORAL | Status: DC

## 2023-08-14 MED ORDER — ACETAMINOPHEN 325 MG PO TABS: 650.0000 mg | ORAL_TABLET | Freq: Once | ORAL | Status: DC

## 2023-08-14 MED ORDER — SODIUM CHLORIDE 0.9 % IV SOLN
510.0000 mg | Freq: Once | INTRAVENOUS | Status: DC
  Filled 2023-08-14: qty 17

## 2023-08-24 ENCOUNTER — Other Ambulatory Visit: Payer: Self-pay | Admitting: Internal Medicine

## 2023-08-26 ENCOUNTER — Other Ambulatory Visit (HOSPITAL_COMMUNITY): Payer: Self-pay

## 2023-08-26 ENCOUNTER — Encounter: Payer: Self-pay | Admitting: Physician Assistant

## 2023-08-26 ENCOUNTER — Other Ambulatory Visit: Payer: Self-pay

## 2023-08-26 MED ORDER — ZEPBOUND 5 MG/0.5ML ~~LOC~~ SOAJ
5.0000 mg | SUBCUTANEOUS | 0 refills | Status: DC
  Filled 2023-08-26 – 2023-08-29 (×2): qty 2, 28d supply, fill #0
  Filled 2023-09-22: qty 2, 28d supply, fill #1

## 2023-08-27 ENCOUNTER — Encounter: Payer: Self-pay | Admitting: Physician Assistant

## 2023-08-28 ENCOUNTER — Encounter: Payer: Self-pay | Admitting: Physician Assistant

## 2023-08-28 ENCOUNTER — Encounter: Payer: Self-pay | Admitting: Internal Medicine

## 2023-08-28 ENCOUNTER — Other Ambulatory Visit (HOSPITAL_COMMUNITY): Payer: Self-pay

## 2023-08-29 ENCOUNTER — Other Ambulatory Visit: Payer: Self-pay

## 2023-08-29 ENCOUNTER — Encounter: Payer: Self-pay | Admitting: Pharmacist

## 2023-08-29 ENCOUNTER — Other Ambulatory Visit (HOSPITAL_COMMUNITY): Payer: Self-pay

## 2023-08-29 ENCOUNTER — Telehealth: Payer: Self-pay

## 2023-08-29 NOTE — Telephone Encounter (Signed)
Good Morning! This patient has reached out about a PA on her zepbound medication needing some questions answered. Could somebody please assist me with this request ?

## 2023-08-30 ENCOUNTER — Other Ambulatory Visit (HOSPITAL_COMMUNITY): Payer: Self-pay

## 2023-08-30 NOTE — Telephone Encounter (Signed)
Patient has been made aware and gave a verbal understanding.

## 2023-08-31 ENCOUNTER — Other Ambulatory Visit (HOSPITAL_COMMUNITY): Payer: Self-pay

## 2023-09-03 ENCOUNTER — Ambulatory Visit: Payer: BC Managed Care – PPO

## 2023-09-03 MED ORDER — HEPARIN SOD (PORK) LOCK FLUSH 100 UNIT/ML IV SOLN: 250.0000 [IU] | Freq: Once | INTRAVENOUS | Status: DC | PRN

## 2023-09-03 MED ORDER — DIPHENHYDRAMINE HCL 25 MG PO CAPS
25.0000 mg | ORAL_CAPSULE | Freq: Once | ORAL | Status: DC
Start: 2023-09-03 — End: 2023-12-27

## 2023-09-03 MED ORDER — HEPARIN SOD (PORK) LOCK FLUSH 100 UNIT/ML IV SOLN: 500.0000 [IU] | Freq: Once | INTRAVENOUS | Status: DC | PRN

## 2023-09-03 MED ORDER — EPINEPHRINE 0.3 MG/0.3ML IJ SOAJ: 0.3000 mg | Freq: Once | INTRAMUSCULAR | Status: DC | PRN

## 2023-09-03 MED ORDER — ACETAMINOPHEN 325 MG PO TABS: 650.0000 mg | ORAL_TABLET | Freq: Once | ORAL | Status: DC

## 2023-09-03 MED ORDER — FERUMOXYTOL INJECTION 510 MG/17 ML: 510.0000 mg | Freq: Once | INTRAVENOUS | Status: DC

## 2023-09-03 MED ORDER — FERUMOXYTOL INJECTION 510 MG/17 ML
510.0000 mg | Freq: Once | INTRAVENOUS | Status: DC
  Filled 2023-09-03: qty 17

## 2023-09-03 MED ORDER — SODIUM CHLORIDE 0.9% FLUSH: 10.0000 mL | Freq: Once | INTRAVENOUS | Status: DC | PRN

## 2023-09-03 MED ORDER — SODIUM CHLORIDE 0.9% FLUSH: 3.0000 mL | Freq: Once | INTRAVENOUS | Status: DC | PRN

## 2023-09-03 MED ORDER — ALTEPLASE 2 MG IJ SOLR
2.0000 mg | Freq: Once | INTRAMUSCULAR | Status: DC | PRN
Start: 2023-09-03 — End: 2023-12-27

## 2023-09-03 MED ORDER — SODIUM CHLORIDE 0.9% FLUSH: 10.0000 mL | Freq: Two times a day (BID) | INTRAVENOUS | Status: DC

## 2023-09-03 MED ORDER — METHYLPREDNISOLONE SODIUM SUCC 125 MG IJ SOLR: 125.0000 mg | Freq: Once | INTRAMUSCULAR | Status: DC | PRN

## 2023-09-03 MED ORDER — DIPHENHYDRAMINE HCL 50 MG/ML IJ SOLN: 50.0000 mg | Freq: Once | INTRAMUSCULAR | Status: DC | PRN

## 2023-09-03 MED ORDER — ANTICOAGULANT SODIUM CITRATE 4% (200MG/5ML) IV SOLN: 5.0000 mL | Freq: Once | Status: DC | PRN

## 2023-09-03 MED ORDER — ALBUTEROL SULFATE HFA 108 (90 BASE) MCG/ACT IN AERS: 2.0000 | INHALATION_SPRAY | Freq: Once | RESPIRATORY_TRACT | Status: DC | PRN

## 2023-09-03 MED ORDER — FAMOTIDINE IN NACL 20-0.9 MG/50ML-% IV SOLN: 20.0000 mg | Freq: Once | INTRAVENOUS | Status: DC | PRN

## 2023-09-17 ENCOUNTER — Telehealth: Payer: Self-pay | Admitting: Internal Medicine

## 2023-09-17 NOTE — Telephone Encounter (Signed)
Left patient a message in regards to rescheduled appointment times/dates

## 2023-09-19 ENCOUNTER — Other Ambulatory Visit (HOSPITAL_COMMUNITY): Payer: Self-pay

## 2023-09-19 ENCOUNTER — Telehealth: Payer: Self-pay | Admitting: Pharmacy Technician

## 2023-09-19 NOTE — Telephone Encounter (Signed)
 Pharmacy Patient Advocate Encounter   Received notification from CoverMyMeds that prior authorization for Zepbound  5MG /0.5ML pen-injectors is required/requested.   Insurance verification completed.   The patient is insured through [facility] .   Per test claim: PA required; PA submitted to above mentioned insurance via CoverMyMeds Key/confirmation #/EOC Key: [id number] Status is pending

## 2023-09-20 NOTE — Telephone Encounter (Signed)
 Would you like me to inform the patient of the denial or are you going to try another medication ?

## 2023-09-20 NOTE — Telephone Encounter (Addendum)
 Pharmacy Patient Advocate Encounter  Received notification from [facility] that Prior Authorization for Zepbound 5MG /0.5ML pen-injectors  has been DENIED. See denial reason below.   PA #/Case ID/Reference #: PA Case ID #: [id number]

## 2023-09-25 ENCOUNTER — Other Ambulatory Visit: Payer: BC Managed Care – PPO

## 2023-09-25 ENCOUNTER — Ambulatory Visit: Payer: BC Managed Care – PPO | Admitting: Internal Medicine

## 2023-09-26 ENCOUNTER — Other Ambulatory Visit: Payer: Self-pay | Admitting: Internal Medicine

## 2023-09-26 DIAGNOSIS — D520 Dietary folate deficiency anemia: Secondary | ICD-10-CM

## 2023-09-28 ENCOUNTER — Other Ambulatory Visit (HOSPITAL_COMMUNITY): Payer: Self-pay

## 2023-09-30 ENCOUNTER — Other Ambulatory Visit (HOSPITAL_COMMUNITY): Payer: Self-pay

## 2023-10-02 ENCOUNTER — Inpatient Hospital Stay: Payer: BC Managed Care – PPO | Attending: Physician Assistant

## 2023-10-02 ENCOUNTER — Inpatient Hospital Stay: Payer: BC Managed Care – PPO | Admitting: Internal Medicine

## 2023-10-03 ENCOUNTER — Telehealth: Payer: BC Managed Care – PPO | Admitting: Physician Assistant

## 2023-10-03 ENCOUNTER — Telehealth: Payer: BC Managed Care – PPO | Admitting: Family Medicine

## 2023-10-03 DIAGNOSIS — N921 Excessive and frequent menstruation with irregular cycle: Secondary | ICD-10-CM

## 2023-10-03 DIAGNOSIS — N920 Excessive and frequent menstruation with regular cycle: Secondary | ICD-10-CM | POA: Diagnosis not present

## 2023-10-03 MED ORDER — TRANEXAMIC ACID 650 MG PO TABS: 1300.0000 mg | ORAL_TABLET | Freq: Three times a day (TID) | ORAL | 0 refills | Status: DC

## 2023-10-03 NOTE — Progress Notes (Signed)
Because you are having heavy bleeding and clotting your condition warrants further evaluation and I recommend that you be seen in a face-to-face visit at your PCP office or local urgent care, or GYN.   NOTE: There will be NO CHARGE for this E-Visit   If you are having a true medical emergency, please call 911.

## 2023-10-03 NOTE — Progress Notes (Signed)
Virtual Visit Consent   Christina Bruce, you are scheduled for a virtual visit with a [facility] provider today. Just as with appointments in the office, your consent must be obtained to participate. Your consent will be active for this visit and any virtual visit you may have with one of our providers in the next 365 days. If you have a MyChart account, a copy of this consent can be sent to you electronically.  As this is a virtual visit, video technology does not allow for your provider to perform a traditional examination. This may limit your provider's ability to fully assess your condition. If your provider identifies any concerns that need to be evaluated in person or the need to arrange testing (such as labs, EKG, etc.), we will make arrangements to do so. Although advances in technology are sophisticated, we cannot ensure that it will always work on either your end or our end. If the connection with a video visit is poor, the visit may have to be switched to a telephone visit. With either a video or telephone visit, we are not always able to ensure that we have a secure connection.  By engaging in this virtual visit, you consent to the provision of healthcare and authorize for your insurance to be billed (if applicable) for the services provided during this visit. Depending on your insurance coverage, you may receive a charge related to this service.  I need to obtain your verbal consent now. Are you willing to proceed with your visit today? Christina Bruce has provided verbal consent on 10/03/2023 for a virtual visit (video or telephone). Margaretann Loveless, PA-C  Date: 10/03/2023 11:12 AM  Virtual Visit via Video Note   I, Margaretann Loveless, connected with  Christina Bruce  ([id number], 02/02/1981) on 10/03/23 at 11:15 AM EST by a video-enabled telemedicine application and verified that I am speaking with the correct person using two identifiers.  Location: Patient: Virtual Visit  Location Patient: Other: work; isolated Provider: Engineer, mining Provider: Home Office   I discussed the limitations of evaluation and management by telemedicine and the availability of in person appointments. The patient expressed understanding and agreed to proceed.    History of Present Illness: Christina Bruce is a 43 y.o. who identifies as a female who was assigned female at birth, and is being seen today for menorrhagia and dysmenorrhea. She does have a history of iron deficiency anemia and has been on iron infusions. Does have Menometrorrhagia on PMH from 02/2022. She denies having any menstrual cycles like this previously. There is family history of fibroids.  Menstrual cycle started on Thursday, 09/26/23, normal. Only lasted until Saturday then stopped. Then restarted on Monday, 09/30/23 and has been heavy and passing clots. Using XL pads about 6 per day, changing often due to large clot soaking up pad. Clots are large, quarter to 50 cent piece.  She denies severe anemia symptoms of lightheadedness, dizziness, severe fatigue.    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   Encounter for general adult medical examination with abnormal findings 06/24/2022   Floaters in visual field, left 06/18/2022   Obesity, morbid, BMI 40.0-49.9 (  HCC) 03/15/2022   Dietary folate deficiency anemia 03/15/2022   Menometrorrhagia 03/15/2022   Allergic rhinitis 12/23/2021   Primary hypertension 12/13/2021   Iron deficiency anemia due to chronic blood loss 12/13/2021   LVH (left ventricular hypertrophy) due to hypertensive disease, without heart failure  12/13/2021   Low vitamin B12 level 06/13/2021   Carpal tunnel syndrome of right wrist 12/08/2015   Migraine without aura and without status migrainosus, not intractable 12/06/2015   Depression, major, single episode, moderate (HCC) 07/07/2015   Cervical radiculitis 06/22/2015    Allergies:  Allergies  Allergen Reactions   Metoprolol Other (See Comments)    Drowsiness    Penicillins Hives   Medications:  Current Outpatient Medications:    amLODipine (NORVASC) 5 MG tablet, Take 1 tablet (5 mg total) by mouth daily., Disp: 90 tablet, Rfl: 1   Eszopiclone 3 MG TABS, Take 1 tablet (3 mg total) by mouth at bedtime. Take immediately before bedtime, Disp: 90 tablet, Rfl: 1   FeFum-FePoly-FA-B Cmp-C-Biot (INTEGRA PLUS) CAPS, Take 1 capsule by mouth every morning., Disp: 30 capsule, Rfl: 2   folic acid (FOLVITE) 1 MG tablet, Take 1 tablet by mouth once daily, Disp: 90 tablet, Rfl: 0   neomycin-polymyxin-hydrocortisone (CORTISPORIN) OTIC solution, Place 3 drops into both ears 3 (three) times daily., Disp: 10 mL, Rfl: 1   pantoprazole (PROTONIX) 20 MG tablet, Take 20 mg by mouth 2 (two) times daily. (Patient not taking: Reported on 07/25/2023), Disp: , Rfl:    SV VITAMIN B-12 ER 1000 MCG TBCR, Take 1 tablet by mouth daily. (Patient not taking: Reported on 07/25/2023), Disp: , Rfl:    tirzepatide (ZEPBOUND) 5 MG/0.5ML Pen, Inject 5 mg into the skin once a week., Disp: 4 mL, Rfl: 0  Current Facility-Administered Medications:    acetaminophen (TYLENOL) tablet 650 mg, 650 mg, Oral, Once, Heilingoetter, Cassandra L, PA-C   diphenhydrAMINE (BENADRYL) capsule 25 mg, 25 mg, Oral, Once, Heilingoetter, Cassandra L, PA-C  Facility-Administered Medications Ordered in Other Visits:    albuterol (VENTOLIN HFA) 108 (90 Base) MCG/ACT inhaler 2 puff, 2 puff, Inhalation, Once PRN, Heilingoetter, Cassandra L, PA-C   alteplase (CATHFLO ACTIVASE) injection 2 mg, 2 mg, Intracatheter, Once PRN, Heilingoetter, Cassandra  L, PA-C   anticoagulant sodium citrate solution 5 mL, 5 mL, Intracatheter, Once PRN, Heilingoetter, Cassandra L, PA-C   diphenhydrAMINE (BENADRYL) injection 50 mg, 50 mg, Intravenous, Once PRN, Heilingoetter, Cassandra L, PA-C   EPINEPHrine (EPI-PEN) injection 0.3 mg, 0.3 mg, Intramuscular, Once PRN, Heilingoetter, Cassandra L, PA-C   famotidine (PEPCID) IVPB 20 mg premix, 20 mg, Intravenous, Once PRN, Heilingoetter, Cassandra L, PA-C   ferumoxytol (FERAHEME) 510 mg in sodium chloride 0.9 % 100 mL IVPB, 510 mg, Intravenous, Once, Heilingoetter, Cassandra L, PA-C   heparin lock flush 100 unit/mL, 500 Units, Intracatheter, Once PRN, Heilingoetter, Cassandra L, PA-C   heparin lock flush 100 unit/mL, 250 Units, Intracatheter, Once PRN, Heilingoetter, Cassandra L, PA-C   methylPREDNISolone sodium succinate (SOLU-MEDROL) 125 mg/2 mL injection 125 mg, 125 mg, Intravenous, Once PRN, Heilingoetter, Cassandra L, PA-C   sodium chloride flush (NS) 0.9 % injection 10 mL, 10 mL, Intracatheter, Once PRN, Heilingoetter, Cassandra L, PA-C   sodium chloride flush (NS) 0.9 % injection 10 mL, 10 mL, Intravenous, Q12H, Heilingoetter, Cassandra L, PA-C   sodium chloride flush (NS) 0.9 % injection 3 mL, 3 mL, Intracatheter, Once PRN, Heilingoetter, Cassandra L, PA-C  Observations/Objective: Patient is well-developed, well-nourished in no acute distress.  Resting comfortably  Head is normocephalic,  atraumatic.  No labored breathing.  Speech is clear and coherent with logical content.  Patient is alert and oriented at baseline.    Assessment and Plan: There are no diagnoses linked to this encounter. - Advised we could not determine source virtually and encouraged to still follow up with PCP or OB/GYN for further evaluation for cause - Will add Tranexamic acid to use 3 times daily for 3-5 days during menstrual cycle to break up clots and lessen flow - If recurs will have enough for one more cycle - Schedule follow  up - Stay hydrated - If symptoms worsen, or anemia symptoms progress, seek immediate medical attention  Follow Up Instructions: I discussed the assessment and treatment plan with the patient. The patient was provided an opportunity to ask questions and all were answered. The patient agreed with the plan and demonstrated an understanding of the instructions.  A copy of instructions were sent to the patient via MyChart unless otherwise noted below.    The patient was advised to call back or seek an in-person evaluation if the symptoms worsen or if the condition fails to improve as anticipated.    Margaretann Loveless, PA-C

## 2023-10-03 NOTE — Patient Instructions (Signed)
Christina Bruce, thank you for joining Margaretann Loveless, PA-C for today's virtual visit.  While this provider is not your primary care provider (PCP), if your PCP is located in our provider database this encounter information will be shared with them immediately following your visit.   A [facility] MyChart account gives you access to today's visit and all your visits, tests, and labs performed at [facility] " click here if you don't have a [facility] MyChart account or go to [email or web address]  Consent: (Patient) Christina Bruce provided verbal consent for this virtual visit at the beginning of the encounter.  Current Medications:  Current Outpatient Medications:    tranexamic acid (LYSTEDA) 650 MG TABS tablet, Take 2 tablets (1,300 mg total) by mouth 3 (three) times daily. For 3-5 days per menstrual cycle, Disp: 60 tablet, Rfl: 0   amLODipine (NORVASC) 5 MG tablet, Take 1 tablet (5 mg total) by mouth daily., Disp: 90 tablet, Rfl: 1   Eszopiclone 3 MG TABS, Take 1 tablet (3 mg total) by mouth at bedtime. Take immediately before bedtime, Disp: 90 tablet, Rfl: 1   FeFum-FePoly-FA-B Cmp-C-Biot (INTEGRA PLUS) CAPS, Take 1 capsule by mouth every morning., Disp: 30 capsule, Rfl: 2   folic acid (FOLVITE) 1 MG tablet, Take 1 tablet by mouth once daily, Disp: 90 tablet, Rfl: 0   neomycin-polymyxin-hydrocortisone (CORTISPORIN) OTIC solution, Place 3 drops into both ears 3 (three) times daily., Disp: 10 mL, Rfl: 1   pantoprazole (PROTONIX) 20 MG tablet, Take 20 mg by mouth 2 (two) times daily. (Patient not taking: Reported on 07/25/2023), Disp: , Rfl:    SV VITAMIN B-12 ER 1000 MCG TBCR, Take 1 tablet by mouth daily. (Patient not taking: Reported on 07/25/2023), Disp: , Rfl:    tirzepatide (ZEPBOUND) 5 MG/0.5ML Pen, Inject 5 mg into the skin once a week., Disp: 4 mL, Rfl: 0  Current Facility-Administered Medications:    acetaminophen (TYLENOL) tablet 650 mg, 650 mg, Oral, Once,  Heilingoetter, Cassandra L, PA-C   diphenhydrAMINE (BENADRYL) capsule 25 mg, 25 mg, Oral, Once, Heilingoetter, Cassandra L, PA-C  Facility-Administered Medications Ordered in Other Visits:    albuterol (VENTOLIN HFA) 108 (90 Base) MCG/ACT inhaler 2 puff, 2 puff, Inhalation, Once PRN, Heilingoetter, Cassandra L, PA-C   alteplase (CATHFLO ACTIVASE) injection 2 mg, 2 mg, Intracatheter, Once PRN, Heilingoetter, Cassandra L, PA-C   anticoagulant sodium citrate solution 5 mL, 5 mL, Intracatheter, Once PRN, Heilingoetter, Cassandra L, PA-C   diphenhydrAMINE (BENADRYL) injection 50 mg, 50 mg, Intravenous, Once PRN, Heilingoetter, Cassandra L, PA-C   EPINEPHrine (EPI-PEN) injection 0.3 mg, 0.3 mg, Intramuscular, Once PRN, Heilingoetter, Cassandra L, PA-C   famotidine (PEPCID) IVPB 20 mg premix, 20 mg, Intravenous, Once PRN, Heilingoetter, Cassandra L, PA-C   ferumoxytol (FERAHEME) 510 mg in sodium chloride 0.9 % 100 mL IVPB, 510 mg, Intravenous, Once, Heilingoetter, Cassandra L, PA-C   heparin lock flush 100 unit/mL, 500 Units, Intracatheter, Once PRN, Heilingoetter, Cassandra L, PA-C   heparin lock flush 100 unit/mL, 250 Units, Intracatheter, Once PRN, Heilingoetter, Cassandra L, PA-C   methylPREDNISolone sodium succinate (SOLU-MEDROL) 125 mg/2 mL injection 125 mg, 125 mg, Intravenous, Once PRN, Heilingoetter, Cassandra L, PA-C   sodium chloride flush (NS) 0.9 % injection 10 mL, 10 mL, Intracatheter, Once PRN, Heilingoetter, Cassandra L, PA-C   sodium chloride flush (NS) 0.9 % injection 10 mL, 10 mL, Intravenous, Q12H, Heilingoetter, Cassandra L, PA-C   sodium chloride flush (NS) 0.9 % injection 3 mL, 3 mL, Intracatheter,  Once PRN, Heilingoetter, Cassandra L, PA-C   Medications ordered in this encounter:  Meds ordered this encounter  Medications   tranexamic acid (LYSTEDA) 650 MG TABS tablet    Sig: Take 2 tablets (1,300 mg total) by mouth 3 (three) times daily. For 3-5 days per menstrual cycle     Dispense:  60 tablet    Refill:  0    Supervising Provider:   Merrilee Jansky [[id number]]     *If you need refills on other medications prior to your next appointment, please contact your pharmacy*  Follow-Up: Call back or seek an in-person evaluation if the symptoms worsen or if the condition fails to improve as anticipated.  [facility] [phone]  Other Instructions Menorrhagia Menorrhagia is a form of abnormal uterine bleeding in which menstrual periods are heavy or last longer than normal. With menorrhagia, the periods may cause enough blood loss and cramping that a woman becomes unable to take part in her usual activities. What are the causes? Common causes of this condition include: Polyps or fibroids. These are noncancerous growths in the uterus. An imbalance of the hormones estrogen and progesterone. Anovulation, which occurs when one of the ovaries does not release an egg during one or more months. A problem with the thyroid gland (hypothyroidism). Side effects of having an intrauterine device (IUD). Side effects of some medicines, such as NSAIDs or blood thinners. A bleeding disorder that stops the blood from clotting normally. In some cases, the cause of this condition is not known. What increases the risk? You are more likely to develop this condition if you have cancer of the uterus. What are the signs or symptoms? Symptoms of this condition include: Routinely having to change your pad or tampon every 1-2 hours because it is soaked. Needing to use pads and tampons at the same time because of heavy bleeding. Needing to wake up to change your pads or tampons during the night. Passing blood clots larger than 1 inch (2.5 cm) in size. Having bleeding that lasts for more than 7 days. Having symptoms of low iron levels (anemia), such as tiredness (fatigue) or shortness of breath. How is this diagnosed? This condition may be diagnosed based on: A physical  exam. Your symptoms and menstrual history. Tests, such as: Blood tests to check if you are pregnant or if you have hormonal changes, a bleeding or thyroid disorder, anemia, or other problems. Pap test to check for cancerous changes, infections, or inflammation. Endometrial biopsy. This test involves removing a tissue sample from the lining of the uterus (endometrium) to be examined under a microscope. Pelvic ultrasound. This test uses sound waves to create images of your uterus, ovaries, and vagina. The images can show if you have fibroids or other growths. Hysteroscopy. For this test, a thin, flexible tube with a light on the end (hysteroscope) is used to look inside your uterus. How is this treated? Treatment may not be needed for this condition. If it is needed, the best treatment for you will depend on: Whether you need to prevent pregnancy. Your desire to have children in the future. The cause and severity of your bleeding. Your personal preference. Medicine Medicines are the first step in treatment. You may be treated with: Hormonal birth control methods. These treatments reduce bleeding during your menstrual period. They include: Birth control pills. Skin patch. Vaginal ring. Shots (injections) that you get every 3 months. Hormonal IUD. Implants that go under the skin. Medicines that thicken the  blood and slow bleeding. Medicines that reduce swelling, such as ibuprofen. Medicines that contain an artificial (synthetic) hormone called progestin. Medicines that make the ovaries stop working for a short time. Iron supplements to treat anemia.  Surgery If medicines do not work, surgery may be done. Surgical options may include: Dilation and curettage (D&C). In this procedure, your health care provider opens the lowest part of the uterus (cervix) and then scrapes or suctions tissue from the endometrium. This reduces menstrual bleeding. Operative hysteroscopy. In this procedure, a  hysteroscope is used to view your uterus and help remove polyps that may be causing heavy periods. Endometrial ablation. This is when various techniques are used to permanently destroy your entire endometrium. After endometrial ablation, most women have little or no menstrual flow. This procedure reduces your ability to become pregnant. Endometrial resection. In this procedure, an electrosurgical wire loop is used to remove the endometrium. This procedure reduces your ability to become pregnant. Hysterectomy. This is surgical removal of your uterus. This is a permanent procedure that stops menstrual periods. Pregnancy is not possible after a hysterectomy. Follow these instructions at home: Medicines Take over-the-counter and prescription medicines only as told by your health care provider. This includes iron pills. Do not change or switch medicines without asking your health care provider. Do not take aspirin or medicines that contain aspirin 1 week before or during your menstrual period. Aspirin may make bleeding worse. Managing constipation Your iron pills may cause constipation. If you are taking prescription iron supplements, you may need to take these actions to prevent or treat constipation: Drink enough fluid to keep your urine pale yellow. Take over-the-counter or prescription medicines. Eat foods that are high in fiber, such as beans, whole grains, and fresh fruits and vegetables. Limit foods that are high in fat and processed sugars, such as fried or sweet foods. General instructions If you need to change your sanitary pad or tampon more than once every 2 hours, limit your activity until the bleeding stops. Eat well-balanced meals, including foods that are high in iron. Foods that have a lot of iron include leafy green vegetables, meat, liver, eggs, and whole-grain breads and cereals. Do not try to lose weight until the abnormal bleeding has stopped and your blood iron level is back to  normal. If you need to lose weight, work with your health care provider to lose weight safely. Keep all follow-up visits. This is important. Contact a health care provider if: You soak through a pad or tampon every 1 or 2 hours, and this happens every time you have a period. You need to use pads and tampons at the same time because you are bleeding so much. You have nausea, vomiting, diarrhea, or other problems related to medicines you are taking. Get help right away if: You soak through more than a pad or tampon in 1 hour. You pass clots bigger than 1 inch (2.5 cm) wide. You feel short of breath. You feel like your heart is beating too fast. You feel dizzy or you faint. You feel very weak or tired. Summary Menorrhagia is a form of abnormal uterine bleeding in which menstrual periods are heavy or last longer than normal. Treatment may not be needed for this condition. If it is needed, it may include medicines or procedures. Take over-the-counter and prescription medicines only as told by your health care provider. This includes iron pills. Get help right away if you have heavy bleeding that soaks through more than a pad  or tampon in 1 hour, you pass large clots, or you feel dizzy, short of breath, or very weak or tired. This information is not intended to replace advice given to you by your health care provider. Make sure you discuss any questions you have with your health care provider. Document Revised: 05/17/2020 Document Reviewed: 05/17/2020 Elsevier Patient Education  2024 Elsevier Inc.    If you have been instructed to have an in-person evaluation today at a local Urgent Care facility, please use the link below. It will take you to a list of all of our available [facility], including address, phone number and hours of operation. Please do not delay care.  [facility]  If you or a family member do not have a primary care provider, use the link below to  schedule a visit and establish care. When you choose a [facility] primary care physician or advanced practice provider, you gain a long-term partner in health. Find a Primary Care Provider  Learn more about [facility]'s in-office and virtual care options: [facility] - Get Care Now

## 2023-10-06 ENCOUNTER — Emergency Department (HOSPITAL_BASED_OUTPATIENT_CLINIC_OR_DEPARTMENT_OTHER): Payer: BC Managed Care – PPO

## 2023-10-06 ENCOUNTER — Encounter (HOSPITAL_BASED_OUTPATIENT_CLINIC_OR_DEPARTMENT_OTHER): Payer: Self-pay

## 2023-10-06 ENCOUNTER — Emergency Department (HOSPITAL_BASED_OUTPATIENT_CLINIC_OR_DEPARTMENT_OTHER)
Admission: EM | Admit: 2023-10-06 | Discharge: 2023-10-06 | Disposition: A | Discharge status: Home / Self Care | Payer: BC Managed Care – PPO | Attending: Emergency Medicine | Admitting: Emergency Medicine

## 2023-10-06 ENCOUNTER — Other Ambulatory Visit: Payer: Self-pay

## 2023-10-06 DIAGNOSIS — N83202 Unspecified ovarian cyst, left side: Secondary | ICD-10-CM | POA: Insufficient documentation

## 2023-10-06 DIAGNOSIS — I10 Essential (primary) hypertension: Secondary | ICD-10-CM | POA: Insufficient documentation

## 2023-10-06 DIAGNOSIS — N939 Abnormal uterine and vaginal bleeding, unspecified: Secondary | ICD-10-CM | POA: Diagnosis not present

## 2023-10-06 DIAGNOSIS — Z79899 Other long term (current) drug therapy: Secondary | ICD-10-CM | POA: Diagnosis not present

## 2023-10-06 DIAGNOSIS — D219 Benign neoplasm of connective and other soft tissue, unspecified: Secondary | ICD-10-CM

## 2023-10-06 DIAGNOSIS — E871 Hypo-osmolality and hyponatremia: Secondary | ICD-10-CM | POA: Insufficient documentation

## 2023-10-06 DIAGNOSIS — D259 Leiomyoma of uterus, unspecified: Secondary | ICD-10-CM | POA: Insufficient documentation

## 2023-10-06 DIAGNOSIS — R102 Pelvic and perineal pain: Secondary | ICD-10-CM | POA: Diagnosis not present

## 2023-10-06 LAB — COMPREHENSIVE METABOLIC PANEL
ALT: 19 U/L (ref 0–44)
AST: 22 U/L (ref 15–41)
Albumin: 3.6 g/dL (ref 3.5–5.0)
Alkaline Phosphatase: 73 U/L (ref 38–126)
Anion gap: 6 (ref 5–15)
BUN: 9 mg/dL (ref 6–20)
CO2: 25 mmol/L (ref 22–32)
Calcium: 9.3 mg/dL (ref 8.9–10.3)
Chloride: 103 mmol/L (ref 98–111)
Creatinine, Ser: 0.57 mg/dL (ref 0.44–1.00)
GFR, Estimated: >60 mL/min (ref >60)
Glucose, Bld: 94 mg/dL (ref 70–99)
Potassium: 4.1 mmol/L (ref 3.5–5.1)
Sodium: 134 mmol/L — ABNORMAL LOW (ref 135–145)
Total Bilirubin: 0.4 mg/dL (ref 0.0–1.2)
Total Protein: 6.8 g/dL (ref 6.5–8.1)

## 2023-10-06 LAB — URINALYSIS, ROUTINE W REFLEX MICROSCOPIC
Bilirubin Urine: NEGATIVE
Glucose, UA: NEGATIVE mg/dL
Ketones, ur: NEGATIVE mg/dL
Leukocytes,Ua: NEGATIVE
Nitrite: NEGATIVE
Protein, ur: NEGATIVE mg/dL
Specific Gravity, Urine: 1.02 (ref 1.005–1.030)
pH: 7.5 (ref 5.0–8.0)

## 2023-10-06 LAB — CBC
HCT: 32.6 % — ABNORMAL LOW (ref 36.0–46.0)
Hemoglobin: 10.9 g/dL — ABNORMAL LOW (ref 12.0–15.0)
MCH: 25.5 pg — ABNORMAL LOW (ref 26.0–34.0)
MCHC: 33.4 g/dL (ref 30.0–36.0)
MCV: 76.3 fL — ABNORMAL LOW (ref 80.0–100.0)
Platelets: 349 10*3/uL (ref 150–400)
RBC: 4.27 MIL/uL (ref 3.87–5.11)
RDW: 18.6 % — ABNORMAL HIGH (ref 11.5–15.5)
WBC: 7.2 10*3/uL (ref 4.0–10.5)
nRBC: 0 % (ref 0.0–0.2)

## 2023-10-06 LAB — URINALYSIS, MICROSCOPIC (REFLEX)

## 2023-10-06 LAB — PREGNANCY, URINE: Preg Test, Ur: NEGATIVE

## 2023-10-06 MED ORDER — IBUPROFEN 600 MG PO TABS: 600.0000 mg | ORAL_TABLET | Freq: Four times a day (QID) | ORAL | 0 refills | Status: DC | PRN

## 2023-10-06 NOTE — ED Triage Notes (Signed)
C/o vaginal bleeding x 12 days. Hx of anemia, got iron infusion 3 weeks ago. Taking tranexamic acid as prescribed by virtual urgent care without relief. Feels like her blood counts are low. Soaking through 2 heavy pads within 4 hours.

## 2023-10-06 NOTE — Discharge Instructions (Signed)
As discussed, pelvic ultrasound did show evidence of fibroids which are most likely causing your continued vaginal bleeding.  Recommend continued use of the TXA as prescribed by prior provider a couple days ago.  Complete full 5-day course.  Will send in higher dose ibuprofen to take along with TXA to help with bleeding.  Will send referral for OB/GYN for more urgent evaluation.  Also recommend calling office should schedule an appointment more quickly.  Please not hesitate to return to emergency department if the worrisome signs and symptoms we discussed become apparent.

## 2023-10-06 NOTE — ED Notes (Signed)
Patient transported to Ultrasound 

## 2023-10-06 NOTE — ED Provider Notes (Signed)
[facility] Provider Note   CSN: [id number] Arrival date & time: 10/06/23  4098     History {Add pertinent medical, surgical, social history, OB history to HPI:1} Chief Complaint  Patient presents with   Vaginal Bleeding    Christina Bruce is a 43 y.o. female.   Vaginal Bleeding   43 year old female presents emergency department with complaints of vaginal bleeding.  Patient reports vaginal bleeding for the past 12 days.  States that she still gets monthly menstrual cycles beginning on 13th of every month.  States that this is the first time that she has had prolonged menstrual cycle like this for some time.  Had a virtual visit with her primary care 3 days ago who prescribed TXA of which she states has not changed her symptoms.  Reports going through heavy pad every 2-3 hours.  Reports pelvic cramping type pain that is consistent with prior menstrual cycles.  Denies any fever, nausea, vomiting, vaginal discharge, change in bowel habits.  Denies any blood thinner use.  States that she does have a history of anemia receiving iron infusions with most recent infusion 3 weeks or so ago.  Denies history of having to receive blood transfusion.  Past medical history significant for hypertension, iron deficiency anemia, migraine, obesity, menometrorrhagia  Home Medications Prior to Admission medications   Medication Sig Start Date End Date Taking? Authorizing Provider  amLODipine (NORVASC) 5 MG tablet Take 1 tablet (5 mg total) by mouth daily. 06/11/23   Etta Grandchild, MD  Eszopiclone 3 MG TABS Take 1 tablet (3 mg total) by mouth at bedtime. Take immediately before bedtime 06/10/23   Etta Grandchild, MD  FeFum-FePoly-FA-B Cmp-C-Biot (INTEGRA PLUS) CAPS Take 1 capsule by mouth every morning. 07/25/23   Heilingoetter, Cassandra L, PA-C  folic acid (FOLVITE) 1 MG tablet Take 1 tablet by mouth once daily 09/26/23   Etta Grandchild, MD   neomycin-polymyxin-hydrocortisone (CORTISPORIN) OTIC solution Place 3 drops into both ears 3 (three) times daily. 06/10/23   Etta Grandchild, MD  pantoprazole (PROTONIX) 20 MG tablet Take 20 mg by mouth 2 (two) times daily. Patient not taking: Reported on 07/25/2023 06/13/21   [provider]  SV VITAMIN B-12 ER 1000 MCG TBCR Take 1 tablet by mouth daily. Patient not taking: Reported on 07/25/2023 06/13/21   [provider]  tirzepatide (ZEPBOUND) 5 MG/0.5ML Pen Inject 5 mg into the skin once a week. 08/26/23   Etta Grandchild, MD  tranexamic acid (LYSTEDA) 650 MG TABS tablet Take 2 tablets (1,300 mg total) by mouth 3 (three) times daily. For 3-5 days per menstrual cycle 10/03/23   Margaretann Loveless, PA-C      Allergies    Metoprolol and Penicillins    Review of Systems   Review of Systems  Genitourinary:  Positive for vaginal bleeding.  All other systems reviewed and are negative.   Physical Exam Updated Vital Signs BP 131/80 (BP Location: Right Arm)   Pulse 78   Temp 98.3 F (36.8 C) (Oral)   Resp 16   Ht 4\' 11"  (1.499 m)   Wt 84.4 kg   LMP 09/24/2023 (Approximate)   SpO2 100%   BMI 37.57 kg/m  Physical Exam Vitals and nursing note reviewed. Exam conducted with a chaperone present.  Constitutional:      General: She is not in acute distress.    Appearance: She is well-developed.  HENT:     Head: Normocephalic  and atraumatic.  Eyes:     Conjunctiva/sclera: Conjunctivae normal.  Cardiovascular:     Rate and Rhythm: Normal rate and regular rhythm.     Heart sounds: No murmur heard. Pulmonary:     Effort: Pulmonary effort is normal. No respiratory distress.     Breath sounds: Normal breath sounds. No wheezing, rhonchi or rales.  Abdominal:     Palpations: Abdomen is soft.     Tenderness: There is no abdominal tenderness. There is no guarding.  Genitourinary:    Comments: Pelvic exam performed with nursing chaperone at bedside.  External exam  unremarkable.  Internal exam no evidence of lesions or traumatic injury to vaginal vault.Cervix unremarkable.  Small area of clotted blood appreciated posterior to cervix.  No active bleeding from cervical os. No CMT.  No obvious adnexal mass.   Musculoskeletal:        General: No swelling.     Cervical back: Neck supple.  Skin:    General: Skin is warm and dry.     Capillary Refill: Capillary refill takes less than 2 seconds.  Neurological:     Mental Status: She is alert.  Psychiatric:        Mood and Affect: Mood normal.     ED Results / Procedures / Treatments   Labs (all labs ordered are listed, but only abnormal results are displayed) Labs Reviewed  PREGNANCY, URINE  URINALYSIS, ROUTINE W REFLEX MICROSCOPIC  CBC  COMPREHENSIVE METABOLIC PANEL    EKG None  Radiology No results found.  Procedures Procedures  {Document cardiac monitor, telemetry assessment procedure when appropriate:1}  Medications Ordered in ED Medications - No data to display  ED Course/ Medical Decision Making/ A&P   {   Click here for ABCD2, HEART and other calculatorsREFRESH Note before signing :1}                              Medical Decision Making Amount and/or Complexity of Data Reviewed Labs: ordered. Radiology: ordered.  Risk Prescription drug management.   This patient presents to the ED for concern of vaginal bleeding, this involves an extensive number of treatment options, and is a complaint that carries with it a high risk of complications and morbidity.  The differential diagnosis includes ovarian dysfunction, coagulopathy, malignancy/endometrial hyperplasia, leiomyoma, mild myoma, polyps, other   Co morbidities that complicate the patient evaluation  See HPI   Additional history obtained:  Additional history obtained from EMR External records from outside source obtained and reviewed including hospital records   Lab Tests:  I Ordered, and personally interpreted  labs.  The pertinent results include: No leukocytosis.  Evidence of anemia 10.9 microcytic in nature.  Platelets within range.  Mild hyponatremia 134 otherwise, S within normal limits.  No transaminitis.  No renal dysfunction.  UA with few bacteria, large hemoglobin but otherwise unremarkable.   Imaging Studies ordered:  I ordered imaging studies including pelvic ultrasound I independently visualized and interpreted imaging which showed no acute finding.  4.7 cm subserosal right posterior fundal fibroid.  6.6 heterogeneous masslike abnormality of cervix.  4.6 cm left ovarian benign functional cyst. I agree with the radiologist interpretation  Cardiac Monitoring: / EKG:  The patient was maintained on a cardiac monitor.  I personally viewed and interpreted the cardiac monitored which showed an underlying rhythm of: Sinus rhythm   Consultations Obtained:  N/a   Problem List / ED Course / Critical interventions /  Medication management  Vaginal bleeding Reevaluation of the patient showed that the patient stayed the same I have reviewed the patients home medicines and have made adjustments as needed   Social Determinants of Health:  Denies tobacco, illicit drug use   Test / Admission - Considered:  Vaginal bleeding Vitals signs  within normal range and stable throughout visit. Laboratory/imaging studies significant for: See above 43 year old female presents emergency department complaints of vaginal bleeding.*** Worrisome signs and symptoms were discussed with the patient, and the patient acknowledged understanding to return to the ED if noticed. Patient was stable upon discharge.    {Document critical care time when appropriate:1} {Document review of labs and clinical decision tools ie heart score, Chads2Vasc2 etc:1}  {Document your independent review of radiology images, and any outside records:1} {Document your discussion with family members, caretakers, and with  consultants:1} {Document social determinants of health affecting pt's care:1} {Document your decision making why or why not admission, treatments were needed:1} Final Clinical Impression(s) / ED Diagnoses Final diagnoses:  None    Rx / DC Orders ED Discharge Orders     None

## 2023-10-07 ENCOUNTER — Ambulatory Visit: Payer: BC Managed Care – PPO | Admitting: Internal Medicine

## 2023-10-09 ENCOUNTER — Other Ambulatory Visit (HOSPITAL_COMMUNITY)
Admission: RE | Admit: 2023-10-09 | Discharge: 2023-10-09 | Disposition: A | Discharge status: Home / Self Care | Payer: BC Managed Care – PPO | Source: Ambulatory Visit | Attending: Family Medicine | Admitting: Family Medicine

## 2023-10-09 ENCOUNTER — Ambulatory Visit (INDEPENDENT_AMBULATORY_CARE_PROVIDER_SITE_OTHER): Payer: BC Managed Care – PPO | Admitting: Family Medicine

## 2023-10-09 ENCOUNTER — Encounter: Payer: Self-pay | Admitting: Family Medicine

## 2023-10-09 VITALS — BP 121/72 | HR 81 | Wt 196.0 lb

## 2023-10-09 DIAGNOSIS — N858 Other specified noninflammatory disorders of uterus: Secondary | ICD-10-CM

## 2023-10-09 DIAGNOSIS — N939 Abnormal uterine and vaginal bleeding, unspecified: Secondary | ICD-10-CM

## 2023-10-09 DIAGNOSIS — Z124 Encounter for screening for malignant neoplasm of cervix: Secondary | ICD-10-CM | POA: Insufficient documentation

## 2023-10-09 MED ORDER — MEGESTROL ACETATE 40 MG PO TABS: 40.0000 mg | ORAL_TABLET | Freq: Two times a day (BID) | ORAL | 5 refills | Status: DC

## 2023-10-09 NOTE — Progress Notes (Signed)
Patient seen in Er on 10/06/23 for bleeding x 2weeks.  Passing quarter size clots.  History of anemia.

## 2023-10-09 NOTE — Progress Notes (Signed)
Subjective:    Patient ID: Christina Bruce, female    DOB: 1981/03/14, 43 y.o.   MRN: [id number]  HPI This is a 43 year old [id number] with a history of 4 cesarean sections and 3 vaginal deliveries and a history of her tubes tied.Marland Kitchen  She was referred to our office from the emergency department due to vaginal bleeding for the last 2 weeks.  Up to this point, her periods have been normal and regular.  She would have bleeding for 5 days with the heaviest being the first 2.  She started having continuous bleeding for the last 2 weeks.  She was seen in the emergency department on 1/19.  She had an ultrasound which showed a 4-1/2 cm fundal subserosal fibroid with a 6.6 cm possible mass in the lower uterine segment in the endometrial cavity.  She was placed on ibuprofen and TXA and appointment was scheduled in our office.  Last Pap smear was in 2016.   Review of Systems     Objective:   Physical Exam Vitals reviewed. Exam conducted with a chaperone present.  Constitutional:      Appearance: Normal appearance.  Abdominal:     General: Abdomen is flat.     Palpations: Abdomen is soft.     Hernia: There is no hernia in the left inguinal area or right inguinal area.  Genitourinary:    Exam position: Supine.     Labia:        Right: No rash, tenderness or lesion.        Left: No rash, tenderness or lesion.      Vagina: No signs of injury and foreign body. No vaginal discharge or erythema.     Comments: Cervix is about half a centimeter dilated.  There was dark maroon blood coming from the cervix and clots.  This was cleaned out with fox swabs.  On bimanual exam uterus is enlarged to about 15 to 16 weeks PAP and ECC obtained Lymphadenopathy:     Lower Body: No right inguinal adenopathy. No left inguinal adenopathy.  Skin:    Capillary Refill: Capillary refill takes less than 2 seconds.  Neurological:     General: No focal deficit present.     Mental Status: She is alert.  Psychiatric:         Mood and Affect: Mood normal.        Behavior: Behavior normal.        Thought Content: Thought content normal.        Judgment: Judgment normal.      ENDOMETRIAL BIOPSY     The indications for endometrial biopsy were reviewed.   Risks of the biopsy including cramping, bleeding, infection, uterine perforation, inadequate specimen and need for additional procedures  were discussed. The patient states she understands and agrees to undergo procedure today. Consent was signed. Time out was performed. Urine HCG was negative. A sterile speculum was placed in the patient's vagina and the cervix was prepped with Betadine. A single-toothed tenaculum was placed on the anterior lip of the cervix to stabilize it. The 3 mm pipelle was introduced into the endometrial cavity without difficulty to a depth of 8 cm, and a moderate amount of maroon blood and possible tissue was obtained and sent to pathology. The instruments were removed from the patient's vagina. Minimal bleeding from the cervix was noted. The patient tolerated the procedure well. Routine post-procedure instructions were given to the patient. The patient will follow up to review the results  and for further management.        Assessment & Plan:  1. Abnormal uterine bleeding (AUB) (Primary) I personally reviewed the images of the ultrasound and spoke with my partner, Dr. Para March who also reviewed the images.  Will get ECC and endometrial biopsy as documented above.  We discussed potential management including with oral medications, surgery etc.  At this point, we will wait for the pathology to return prior to deciding whether an is necessary.  If pathology is benign, could proceed with surgery.  Will get her arranged with GYN surgery.  The present, will place the patient on Megace and titrate the medication until bleeding has stopped. - Surgical pathology - Surgical pathology  2. Uterine mass  - Surgical pathology - Surgical pathology  3.  Cervical cancer screening Pap collected - Cytology - PAP( [place])

## 2023-10-11 ENCOUNTER — Encounter: Payer: Self-pay | Admitting: Family Medicine

## 2023-10-11 LAB — SURGICAL PATHOLOGY

## 2023-10-11 NOTE — Addendum Note (Signed)
Addended by: Levie Heritage on: 10/11/2023 11:11 AM   Modules accepted: Orders

## 2023-10-16 LAB — CYTOLOGY - PAP
Comment: NEGATIVE
Diagnosis: UNDETERMINED — AB
High risk HPV: NEGATIVE

## 2023-10-19 ENCOUNTER — Other Ambulatory Visit: Payer: Self-pay | Admitting: Internal Medicine

## 2023-10-21 ENCOUNTER — Ambulatory Visit: Payer: BC Managed Care – PPO | Admitting: Internal Medicine

## 2023-10-22 ENCOUNTER — Other Ambulatory Visit (HOSPITAL_COMMUNITY): Payer: Self-pay

## 2023-10-22 ENCOUNTER — Other Ambulatory Visit: Payer: Self-pay | Admitting: Internal Medicine

## 2023-10-22 MED ORDER — ZEPBOUND 5 MG/0.5ML ~~LOC~~ SOAJ
5.0000 mg | SUBCUTANEOUS | 0 refills | Status: DC
  Filled 2023-10-22: qty 4, 56d supply, fill #0

## 2023-10-27 ENCOUNTER — Ambulatory Visit (HOSPITAL_BASED_OUTPATIENT_CLINIC_OR_DEPARTMENT_OTHER): Payer: BC Managed Care – PPO

## 2023-11-01 ENCOUNTER — Ambulatory Visit: Payer: BC Managed Care – PPO | Admitting: Obstetrics and Gynecology

## 2023-11-01 VITALS — BP 120/76 | HR 78 | Wt 188.0 lb

## 2023-11-01 DIAGNOSIS — N858 Other specified noninflammatory disorders of uterus: Secondary | ICD-10-CM | POA: Diagnosis not present

## 2023-11-01 DIAGNOSIS — N939 Abnormal uterine and vaginal bleeding, unspecified: Secondary | ICD-10-CM | POA: Diagnosis not present

## 2023-11-01 NOTE — Progress Notes (Signed)
GYNECOLOGY VISIT  Patient name: Christina Bruce MRN [id number]  Date of birth: [birth year] Chief Complaint:   Consult   History:  Christina Bruce is a 43 y.o. [phone] being seen today for AUB with new LUS/cervical mass. Discussed the use of AI scribe software for clinical note transcription with the patient, who gave verbal consent to proceed.  History of Present Illness   Christina Bruce is a 43 year old female with anemia who presents with new onset heavy menstrual bleeding.  She has been experiencing new onset heavy menstrual bleeding that began last week. Her period started on Thursday and was expected to end by Wednesday, but instead, it stopped and then heavy clotting began. She has never experienced irregular bleeding before, and her periods were typically five days long, with two days of heavy flow followed by lighter bleeding.  She visited the emergency room where imaging suggested the presence of a fibroid. A biopsy was performed, but it only sampled cells from the cervix, not the uterus.  She has a history of anemia and received an iron drip one to two months ago. She has been anemic for a long time, which she believes may be related to her menstrual history. She is currently taking medication to manage her heavy bleeding and maintain her hemoglobin levels.         Past Medical History:  Diagnosis Date   Anemia    Headache(784.0)    Rh negative, maternal     Past Surgical History:  Procedure Laterality Date   CESAREAN SECTION     TUBAL LIGATION Bilateral     The following portions of the patient's history were reviewed and updated as appropriate: allergies, current medications, past family history, past medical history, past social history, past surgical history and problem list.   Health Maintenance:   Last pap     Component Value Date/Time   DIAGPAP (A) 10/09/2023 1214    - Atypical squamous cells of undetermined significance (ASC-US)   HPVHIGH Negative  10/09/2023 1214   ADEQPAP  10/09/2023 1214    Satisfactory but limited for evaluation with partially obscuring blood;   ADEQPAP transformation zone component present. 10/09/2023 1214    High Risk HPV: Positive  Adequacy:  Satisfactory for evaluation, transformation zone component PRESENT  Diagnosis:  Atypical squamous cells of undetermined significance (ASC-US)  Last mammogram: will need to start this year   Review of Systems:  Pertinent items are noted in HPI. Comprehensive review of systems was otherwise negative.   Objective:  Physical Exam BP 120/76 (BP Location: Left Arm, Patient Position: Sitting, Cuff Size: Large)   Pulse 78   Wt 188 lb (85.3 kg)   LMP 10/28/2023 (Exact Date)   BMI 37.97 kg/m    Physical Exam Vitals and nursing note reviewed.  Constitutional:      Appearance: Normal appearance.  HENT:     Head: Normocephalic and atraumatic.  Pulmonary:     Effort: Pulmonary effort is normal.  Skin:    General: Skin is warm and dry.  Neurological:     General: No focal deficit present.     Mental Status: She is alert.  Psychiatric:        Mood and Affect: Mood normal.        Behavior: Behavior normal.        Thought Content: Thought content normal.        Judgment: Judgment normal.      Labs and Imaging US  PELVIC COMPLETE W TRANSVAGINAL AND TORSION R/O Result Date: 10/06/2023 CLINICAL DATA:  Heavy vaginal bleeding for 2 weeks with cramping. EXAM: TRANSABDOMINAL AND TRANSVAGINAL ULTRASOUND OF PELVIS DOPPLER ULTRASOUND OF OVARIES TECHNIQUE: Both transabdominal and transvaginal ultrasound examinations of the pelvis were performed. Transabdominal technique was performed for global imaging of the pelvis including uterus, ovaries, adnexal regions, and pelvic cul-de-sac. It was necessary to proceed with endovaginal exam following the transabdominal exam to visualize the endometrium and ovaries to better advantage. Color and duplex Doppler ultrasound was utilized to  evaluate blood flow to the ovaries. COMPARISON:  None Available. FINDINGS: Uterus Measurements: 14.2 x 6.8 x 7.2 cm = volume: 364 mL. Subserosal right posterior fundal fibroid measuring 4.7 x 3.2 x 4.2 cm. Heterogeneous mass arises from the cervix that may also reflect a fibroid, but could be due to blood products distending the endocervical canal. This measures 6.6 x 4.3 x 5.6 cm. Endometrium Thickness: 9 mm. No focal abnormality. Margins are somewhat poorly defined. Right ovary Measurements: 4.7 x 2.3 x 3.2 cm = volume: 18 mL. Normal appearance/no adnexal mass. Left ovary Measurements: 4.3 x 2.7 x 5.5 cm = volume: 32 mL. Dominant cyst measures 4.6 x 2.9 x 4 cm. Pulsed Doppler evaluation of both ovaries demonstrates normal low-resistance arterial and venous waveforms. Other findings No abnormal free fluid. IMPRESSION: 1. No acute findings. 2. 4.7 cm subserosal right posterior fundal fibroid. 3. 6.6 cm heterogeneous masslike abnormality at the cervix that could reflect a fibroid or be due to blood products distending the endocervical canal. Consider further evaluation with a pelvic MRI. 4. 4.6 cm left ovarian benign functional cyst. Electronically Signed   By: Amie Portland M.D.   On: 10/06/2023 11:21       Assessment & Plan:   Assessment and Plan    Abnormal Uterine Bleeding New onset heavy bleeding with clots, suspected fibroid on imaging. Previous biopsy only sampled cervical cells, not endometrial. Discussed options including hysteroscopy for better sampling and potential removal of fibroid, or hysterectomy. Patient prefers less invasive approach. -Schedule hysteroscopy to visualize and potentially remove fibroid, and obtain endometrial sample. -Continue current medication for heavy bleeding as needed. -Review upcoming MRI results for additional information.  Chronic Anemia History of chronic anemia, recent iron infusion. Likely exacerbated by heavy menstrual bleeding. -Monitor hemoglobin levels  to ensure adequate for upcoming procedure. -Consider further iron supplementation if necessary post-procedure.      Will follow up pelvic MRI scheduled for next week   Routine preventative health maintenance measures emphasized.  Lorriane Shire, MD Minimally Invasive Gynecologic Surgery [facility], [facility]

## 2023-11-03 ENCOUNTER — Ambulatory Visit (HOSPITAL_BASED_OUTPATIENT_CLINIC_OR_DEPARTMENT_OTHER)
Admission: RE | Admit: 2023-11-03 | Discharge: 2023-11-03 | Disposition: A | Discharge status: Home / Self Care | Payer: BC Managed Care – PPO | Source: Ambulatory Visit | Attending: Family Medicine | Admitting: Family Medicine

## 2023-11-03 DIAGNOSIS — N858 Other specified noninflammatory disorders of uterus: Secondary | ICD-10-CM | POA: Insufficient documentation

## 2023-11-03 DIAGNOSIS — D251 Intramural leiomyoma of uterus: Secondary | ICD-10-CM | POA: Diagnosis not present

## 2023-11-03 DIAGNOSIS — N939 Abnormal uterine and vaginal bleeding, unspecified: Secondary | ICD-10-CM | POA: Diagnosis not present

## 2023-11-03 MED ORDER — GADOBUTROL 1 MMOL/ML IV SOLN
9.0000 mL | Freq: Once | INTRAVENOUS | Status: AC | PRN
  Administered 2023-11-03: 9 mL via INTRAVENOUS

## 2023-11-06 ENCOUNTER — Ambulatory Visit: Payer: BC Managed Care – PPO | Admitting: Internal Medicine

## 2023-11-07 ENCOUNTER — Encounter: Payer: Self-pay | Admitting: Obstetrics and Gynecology

## 2023-11-12 ENCOUNTER — Telehealth: Payer: Self-pay

## 2023-11-12 NOTE — Telephone Encounter (Signed)
 The office notified me that patient called to schedule surgery date. I called patient to see if she was available for surgery on 12/23/23 at 7:30 am w/ Dr. Briscoe Deutscher. Left voicemail asking patient to call me back to schedule at [phone]

## 2023-11-13 ENCOUNTER — Encounter: Payer: Self-pay | Admitting: Obstetrics and Gynecology

## 2023-11-18 ENCOUNTER — Other Ambulatory Visit: Payer: Self-pay | Admitting: Internal Medicine

## 2023-11-18 DIAGNOSIS — I119 Hypertensive heart disease without heart failure: Secondary | ICD-10-CM

## 2023-11-18 DIAGNOSIS — I10 Essential (primary) hypertension: Secondary | ICD-10-CM

## 2023-12-02 ENCOUNTER — Other Ambulatory Visit: Payer: Self-pay | Admitting: Internal Medicine

## 2023-12-03 ENCOUNTER — Other Ambulatory Visit (HOSPITAL_COMMUNITY): Payer: Self-pay

## 2023-12-03 MED ORDER — ZEPBOUND 5 MG/0.5ML ~~LOC~~ SOAJ
5.0000 mg | SUBCUTANEOUS | 0 refills | Status: DC
Start: 2023-12-03 — End: 2023-12-20
  Filled 2023-12-03 – 2023-12-11 (×2): qty 4, 56d supply, fill #0

## 2023-12-04 ENCOUNTER — Other Ambulatory Visit (HOSPITAL_COMMUNITY): Payer: Self-pay

## 2023-12-04 ENCOUNTER — Other Ambulatory Visit: Payer: Self-pay

## 2023-12-04 ENCOUNTER — Encounter: Payer: Self-pay | Admitting: Pharmacist

## 2023-12-05 ENCOUNTER — Other Ambulatory Visit (HOSPITAL_COMMUNITY): Payer: Self-pay

## 2023-12-09 ENCOUNTER — Other Ambulatory Visit: Payer: Self-pay

## 2023-12-11 ENCOUNTER — Other Ambulatory Visit (HOSPITAL_COMMUNITY): Payer: Self-pay

## 2023-12-15 ENCOUNTER — Other Ambulatory Visit: Payer: Self-pay | Admitting: Internal Medicine

## 2023-12-15 DIAGNOSIS — I10 Essential (primary) hypertension: Secondary | ICD-10-CM

## 2023-12-15 DIAGNOSIS — I119 Hypertensive heart disease without heart failure: Secondary | ICD-10-CM

## 2023-12-18 NOTE — Progress Notes (Unsigned)
   Established Patient Office Visit  Subjective   Patient ID: Christina Bruce, female    DOB: 24-Jul-1981  Age: 42 y.o. MRN: [id number]  No chief complaint on file.   HPI Patient presents today for medication management. Reports compliance with medication regimen. Denies the need for refills. Not fasting today. Denies other concerns. Medical history as outlined below.  ROS Per HPI    Objective:     There were no vitals taken for this visit.  Physical Exam Vitals and nursing note reviewed.  Constitutional:      General: She is not in acute distress.    Appearance: Normal appearance. She is normal weight.  HENT:     Head: Normocephalic and atraumatic.     Right Ear: External ear normal.     Left Ear: External ear normal.     Nose: Nose normal.     Mouth/Throat:     Mouth: Mucous membranes are moist.     Pharynx: Oropharynx is clear.  Eyes:     Extraocular Movements: Extraocular movements intact.     Pupils: Pupils are equal, round, and reactive to light.  Cardiovascular:     Rate and Rhythm: Normal rate and regular rhythm.     Pulses: Normal pulses.     Heart sounds: Normal heart sounds.  Pulmonary:     Effort: Pulmonary effort is normal. No respiratory distress.     Breath sounds: Normal breath sounds. No wheezing, rhonchi or rales.  Musculoskeletal:        General: Normal range of motion.     Cervical back: Normal range of motion.     Right lower leg: No edema.     Left lower leg: No edema.  Lymphadenopathy:     Cervical: No cervical adenopathy.  Neurological:     General: No focal deficit present.     Mental Status: She is alert and oriented to person, place, and time.  Psychiatric:        Mood and Affect: Mood normal.        Thought Content: Thought content normal.    No results found for any visits on 12/20/23.   The ASCVD Risk score (Arnett DK, et al., 2019) failed to calculate for the following reasons:   Cannot find a previous HDL lab   Cannot  find a previous total cholesterol lab    Assessment & Plan:   Primary hypertension  LVH (left ventricular hypertrophy) due to hypertensive disease, without heart failure  Medication management  Iron deficiency anemia, unspecified iron deficiency anemia type  Low vitamin B12 level     No follow-ups on file.    Sherald Barge, FNP

## 2023-12-19 ENCOUNTER — Encounter (HOSPITAL_COMMUNITY): Payer: Self-pay | Admitting: Obstetrics and Gynecology

## 2023-12-19 ENCOUNTER — Other Ambulatory Visit: Payer: Self-pay

## 2023-12-19 NOTE — Progress Notes (Signed)
 PCP - Etta Grandchild, MD  Cardiologist -    PPM/ICD - denies Device Orders - n/a Rep Notified - n/a  Chest x-ray -  EKG - 06-11-23 Stress Test -  ECHO - 12-21-21 Cardiac Cath -   CPAP - denies DM - denies  Blood Thinner Instructions: denies Aspirin Instructions: n/a  ERAS Protcol - NPO  COVID TEST- n.a  Anesthesia review: no  Patient verbally denies any shortness of breath, fever, cough and chest pain during phone call   -------------  SDW INSTRUCTIONS given:  Your procedure is scheduled on December 23, 2023.  Report to [facility] Main Entrance "A" at 5:30 A.M., and check in at the Admitting office.  Call this number if you have problems the morning of surgery:  [phone]   Remember:  Do not eat  or drink after midnight the night before your surgery      Take these medicines the morning of surgery with A SIP OF WATER NONE  As of today, STOP taking any Aspirin (unless otherwise instructed by your surgeon) Aleve, Naproxen, Ibuprofen, Motrin, Advil, Goody's, BC's, all herbal medications, fish oil, and all vitamins.                      Do not wear jewelry, make up, or nail polish            Do not wear lotions, powders, perfumes/colognes, or deodorant.            Do not shave 48 hours prior to surgery.  Men may shave face and neck.            Do not bring valuables to the hospital.            [facility] is not responsible for any belongings or valuables.  Do NOT Smoke (Tobacco/Vaping) 24 hours prior to your procedure If you use a CPAP at night, you may bring all equipment for your overnight stay.   Contacts, glasses, dentures or bridgework may not be worn into surgery.      For patients admitted to the hospital, discharge time will be determined by your treatment team.   Patients discharged the day of surgery will not be allowed to drive home, and someone needs to stay with them for 24 hours.    Special instructions:   - Preparing For  Surgery  Before surgery, you can play an important role. Because skin is not sterile, your skin needs to be as free of germs as possible. You can reduce the number of germs on your skin by washing with CHG (chlorahexidine gluconate) Soap before surgery.  CHG is an antiseptic cleaner which kills germs and bonds with the skin to continue killing germs even after washing.    Oral Hygiene is also important to reduce your risk of infection.  Remember - BRUSH YOUR TEETH THE MORNING OF SURGERY WITH YOUR REGULAR TOOTHPASTE  Please do not use if you have an allergy to CHG or antibacterial soaps. If your skin becomes reddened/irritated stop using the CHG.  Do not shave (including legs and underarms) for at least 48 hours prior to first CHG shower. It is OK to shave your face.  Please follow these instructions carefully.   Shower the NIGHT BEFORE SURGERY and the MORNING OF SURGERY with DIAL Soap.   Pat yourself dry with a CLEAN TOWEL.  Wear CLEAN PAJAMAS to bed the night before surgery  Place CLEAN SHEETS on your bed the  night of your first shower and DO NOT SLEEP WITH PETS.   Day of Surgery: Please shower morning of surgery  Wear Clean/Comfortable clothing the morning of surgery Do not apply any deodorants/lotions.   Remember to brush your teeth WITH YOUR REGULAR TOOTHPASTE.   Questions were answered. Patient verbalized understanding of instructions.

## 2023-12-20 ENCOUNTER — Ambulatory Visit (INDEPENDENT_AMBULATORY_CARE_PROVIDER_SITE_OTHER): Admitting: Family Medicine

## 2023-12-20 ENCOUNTER — Encounter: Payer: Self-pay | Admitting: Family Medicine

## 2023-12-20 VITALS — BP 144/110 | HR 93 | Temp 98.5°F | Ht 59.0 in | Wt 185.2 lb

## 2023-12-20 DIAGNOSIS — Z79899 Other long term (current) drug therapy: Secondary | ICD-10-CM | POA: Diagnosis not present

## 2023-12-20 DIAGNOSIS — D509 Iron deficiency anemia, unspecified: Secondary | ICD-10-CM | POA: Diagnosis not present

## 2023-12-20 DIAGNOSIS — R7989 Other specified abnormal findings of blood chemistry: Secondary | ICD-10-CM | POA: Diagnosis not present

## 2023-12-20 DIAGNOSIS — D520 Dietary folate deficiency anemia: Secondary | ICD-10-CM

## 2023-12-20 DIAGNOSIS — G4709 Other insomnia: Secondary | ICD-10-CM

## 2023-12-20 DIAGNOSIS — I10 Essential (primary) hypertension: Secondary | ICD-10-CM | POA: Diagnosis not present

## 2023-12-20 DIAGNOSIS — I119 Hypertensive heart disease without heart failure: Secondary | ICD-10-CM

## 2023-12-20 MED ORDER — FOLIC ACID 1 MG PO TABS: 1.0000 mg | ORAL_TABLET | Freq: Every day | ORAL | 0 refills | Status: DC

## 2023-12-20 MED ORDER — AMLODIPINE BESYLATE 5 MG PO TABS: 5.0000 mg | ORAL_TABLET | Freq: Every day | ORAL | 0 refills | Status: DC

## 2023-12-20 MED ORDER — TIRZEPATIDE 7.5 MG/0.5ML ~~LOC~~ SOAJ: 7.5000 mg | SUBCUTANEOUS | 3 refills | Status: DC

## 2023-12-20 MED ORDER — ESZOPICLONE 3 MG PO TABS: 3.0000 mg | ORAL_TABLET | Freq: Every day | ORAL | 3 refills | Status: DC

## 2023-12-20 NOTE — Assessment & Plan Note (Signed)
 Vitamin B12 can be found in meats, and rich foods, supplements.  Continue efforts and balanced diet

## 2023-12-20 NOTE — Assessment & Plan Note (Signed)
 Folic acid refill today

## 2023-12-20 NOTE — Assessment & Plan Note (Signed)
Eszopiclone refilled 

## 2023-12-20 NOTE — Assessment & Plan Note (Signed)
 Amlodipine refilled Discussed checking blood pressures at home Follow-up sooner if blood pressures are remaining elevated, 140/90 or higher

## 2023-12-20 NOTE — Assessment & Plan Note (Signed)
Continue iron rich diet

## 2023-12-20 NOTE — Patient Instructions (Addendum)
 Refills sent to the pharmacy.  INCREASED Mounjaro 7.5mg  once weekly  Follow up with PCP in about 6 months.

## 2023-12-20 NOTE — Assessment & Plan Note (Signed)
 Follow-up in 6 months with PCP  Refills today

## 2023-12-22 NOTE — Anesthesia Preprocedure Evaluation (Signed)
 Anesthesia Evaluation  Patient identified by MRN, date of birth, ID band Patient awake    Reviewed: Allergy & Precautions, NPO status , Patient's Chart, lab work & pertinent test results  Airway Mallampati: I  TM Distance: >3 FB Neck ROM: Full    Dental  (+) Missing, Dental Advisory Given, Chipped   Pulmonary neg pulmonary ROS   Pulmonary exam normal breath sounds clear to auscultation       Cardiovascular hypertension, Pt. on medications Normal cardiovascular exam Rhythm:Regular Rate:Normal     Neuro/Psych  Headaches PSYCHIATRIC DISORDERS  Depression       GI/Hepatic negative GI ROS, Neg liver ROS,,,  Endo/Other  negative endocrine ROS  Obese BMI 37  Renal/GU negative Renal ROS  negative genitourinary   Musculoskeletal negative musculoskeletal ROS (+)    Abdominal   Peds  Hematology negative hematology ROS (+)   Anesthesia Other Findings   Reproductive/Obstetrics                             Anesthesia Physical Anesthesia Plan  ASA: 2  Anesthesia Plan: General   Post-op Pain Management: Tylenol PO (pre-op)*   Induction: Intravenous  PONV Risk Score and Plan: 3 and Ondansetron, Dexamethasone and Midazolam  Airway Management Planned: LMA  Additional Equipment:   Intra-op Plan:   Post-operative Plan: Extubation in OR  Informed Consent: I have reviewed the patients History and Physical, chart, labs and discussed the procedure including the risks, benefits and alternatives for the proposed anesthesia with the patient or authorized representative who has indicated his/her understanding and acceptance.     Dental advisory given  Plan Discussed with: CRNA  Anesthesia Plan Comments:        Anesthesia Quick Evaluation

## 2023-12-23 ENCOUNTER — Other Ambulatory Visit: Payer: Self-pay

## 2023-12-23 ENCOUNTER — Encounter (HOSPITAL_COMMUNITY)
Admission: RE | Disposition: A | Discharge status: Home / Self Care | Payer: Self-pay | Source: Home / Self Care | Attending: Obstetrics and Gynecology

## 2023-12-23 ENCOUNTER — Ambulatory Visit (HOSPITAL_COMMUNITY)
Admission: RE | Admit: 2023-12-23 | Discharge: 2023-12-23 | Disposition: A | Discharge status: Home / Self Care | Payer: BC Managed Care – PPO | Attending: Obstetrics and Gynecology | Admitting: Obstetrics and Gynecology

## 2023-12-23 ENCOUNTER — Ambulatory Visit (HOSPITAL_COMMUNITY): Payer: Self-pay | Admitting: Anesthesiology

## 2023-12-23 DIAGNOSIS — N938 Other specified abnormal uterine and vaginal bleeding: Secondary | ICD-10-CM

## 2023-12-23 DIAGNOSIS — Z6837 Body mass index (BMI) 37.0-37.9, adult: Secondary | ICD-10-CM | POA: Insufficient documentation

## 2023-12-23 DIAGNOSIS — Z79899 Other long term (current) drug therapy: Secondary | ICD-10-CM | POA: Diagnosis not present

## 2023-12-23 DIAGNOSIS — N840 Polyp of corpus uteri: Secondary | ICD-10-CM | POA: Diagnosis not present

## 2023-12-23 DIAGNOSIS — I10 Essential (primary) hypertension: Secondary | ICD-10-CM | POA: Insufficient documentation

## 2023-12-23 DIAGNOSIS — E669 Obesity, unspecified: Secondary | ICD-10-CM | POA: Insufficient documentation

## 2023-12-23 DIAGNOSIS — F32A Depression, unspecified: Secondary | ICD-10-CM | POA: Diagnosis not present

## 2023-12-23 DIAGNOSIS — N921 Excessive and frequent menstruation with irregular cycle: Secondary | ICD-10-CM

## 2023-12-23 DIAGNOSIS — R519 Headache, unspecified: Secondary | ICD-10-CM | POA: Insufficient documentation

## 2023-12-23 DIAGNOSIS — N858 Other specified noninflammatory disorders of uterus: Secondary | ICD-10-CM | POA: Diagnosis not present

## 2023-12-23 HISTORY — DX: Essential (primary) hypertension: I10

## 2023-12-23 HISTORY — PX: HYSTEROSCOPY: SHX211

## 2023-12-23 HISTORY — DX: Cardiomegaly: I51.7

## 2023-12-23 LAB — CBC
HCT: 35.4 % — ABNORMAL LOW (ref 36.0–46.0)
Hemoglobin: 11.2 g/dL — ABNORMAL LOW (ref 12.0–15.0)
MCH: 23.1 pg — ABNORMAL LOW (ref 26.0–34.0)
MCHC: 31.6 g/dL (ref 30.0–36.0)
MCV: 73 fL — ABNORMAL LOW (ref 80.0–100.0)
Platelets: 387 10*3/uL (ref 150–400)
RBC: 4.85 MIL/uL (ref 3.87–5.11)
RDW: 15 % (ref 11.5–15.5)
WBC: 6.9 10*3/uL (ref 4.0–10.5)
nRBC: 0 % (ref 0.0–0.2)

## 2023-12-23 LAB — BASIC METABOLIC PANEL WITH GFR
Anion gap: 7 (ref 5–15)
BUN: 10 mg/dL (ref 6–20)
CO2: 25 mmol/L (ref 22–32)
Calcium: 9.4 mg/dL (ref 8.9–10.3)
Chloride: 106 mmol/L (ref 98–111)
Creatinine, Ser: 0.81 mg/dL (ref 0.44–1.00)
GFR, Estimated: >60 mL/min (ref >60)
Glucose, Bld: 103 mg/dL — ABNORMAL HIGH (ref 70–99)
Potassium: 3.8 mmol/L (ref 3.5–5.1)
Sodium: 138 mmol/L (ref 135–145)

## 2023-12-23 LAB — POCT PREGNANCY, URINE: Preg Test, Ur: NEGATIVE

## 2023-12-23 SURGERY — ABLATION, ENDOMETRIUM, HYSTEROSCOPIC
Anesthesia: General | Site: Uterus

## 2023-12-23 MED ORDER — OXYCODONE HCL 5 MG PO TABS: 5.0000 mg | ORAL_TABLET | Freq: Once | ORAL | Status: DC | PRN

## 2023-12-23 MED ORDER — LACTATED RINGERS IV SOLN: INTRAVENOUS | Status: DC

## 2023-12-23 MED ORDER — BUPIVACAINE HCL (PF) 0.5 % IJ SOLN
INTRAMUSCULAR | Status: AC
  Filled 2023-12-23: qty 10

## 2023-12-23 MED ORDER — PHENYLEPHRINE 80 MCG/ML (10ML) SYRINGE FOR IV PUSH (FOR BLOOD PRESSURE SUPPORT)
PREFILLED_SYRINGE | INTRAVENOUS | Status: DC | PRN
  Administered 2023-12-23: 80 ug via INTRAVENOUS
  Administered 2023-12-23: 160 ug via INTRAVENOUS

## 2023-12-23 MED ORDER — PHENYLEPHRINE HCL-NACL 20-0.9 MG/250ML-% IV SOLN: INTRAVENOUS | Status: DC | PRN

## 2023-12-23 MED ORDER — CHLORHEXIDINE GLUCONATE 0.12 % MT SOLN
15.0000 mL | Freq: Once | OROMUCOSAL | Status: AC
  Administered 2023-12-23: 15 mL via OROMUCOSAL
  Filled 2023-12-23: qty 15

## 2023-12-23 MED ORDER — BUPIVACAINE HCL (PF) 0.5 % IJ SOLN
INTRAMUSCULAR | Status: AC
Start: 2023-12-23 — End: ?
  Filled 2023-12-23: qty 10

## 2023-12-23 MED ORDER — AMISULPRIDE (ANTIEMETIC) 5 MG/2ML IV SOLN
INTRAVENOUS | Status: AC
  Filled 2023-12-23: qty 4

## 2023-12-23 MED ORDER — KETOROLAC TROMETHAMINE 15 MG/ML IJ SOLN
INTRAMUSCULAR | Status: DC | PRN
  Administered 2023-12-23: 30 mg via INTRAVENOUS

## 2023-12-23 MED ORDER — DEXAMETHASONE SODIUM PHOSPHATE 10 MG/ML IJ SOLN
INTRAMUSCULAR | Status: DC | PRN
  Administered 2023-12-23: 10 mg via INTRAVENOUS

## 2023-12-23 MED ORDER — ORAL CARE MOUTH RINSE: 15.0000 mL | Freq: Once | OROMUCOSAL | Status: AC

## 2023-12-23 MED ORDER — FENTANYL CITRATE (PF) 250 MCG/5ML IJ SOLN
INTRAMUSCULAR | Status: DC | PRN
  Administered 2023-12-23 (×2): 50 ug via INTRAVENOUS

## 2023-12-23 MED ORDER — ACETAMINOPHEN 500 MG PO TABS
1000.0000 mg | ORAL_TABLET | ORAL | Status: DC
  Filled 2023-12-23: qty 2

## 2023-12-23 MED ORDER — OXYCODONE HCL 5 MG/5ML PO SOLN: 5.0000 mg | Freq: Once | ORAL | Status: DC | PRN

## 2023-12-23 MED ORDER — FENTANYL CITRATE (PF) 100 MCG/2ML IJ SOLN: 25.0000 ug | INTRAMUSCULAR | Status: DC | PRN

## 2023-12-23 MED ORDER — PROPOFOL 10 MG/ML IV BOLUS
INTRAVENOUS | Status: AC
  Filled 2023-12-23: qty 20

## 2023-12-23 MED ORDER — MIDAZOLAM HCL 2 MG/2ML IJ SOLN
INTRAMUSCULAR | Status: AC
  Filled 2023-12-23: qty 2

## 2023-12-23 MED ORDER — MIDAZOLAM HCL 2 MG/2ML IJ SOLN
INTRAMUSCULAR | Status: DC | PRN
  Administered 2023-12-23: 2 mg via INTRAVENOUS

## 2023-12-23 MED ORDER — LIDOCAINE 2% (20 MG/ML) 5 ML SYRINGE
INTRAMUSCULAR | Status: DC | PRN
  Administered 2023-12-23: 80 mg via INTRAVENOUS

## 2023-12-23 MED ORDER — ONDANSETRON HCL 4 MG/2ML IJ SOLN
INTRAMUSCULAR | Status: DC | PRN
  Administered 2023-12-23: 4 mg via INTRAVENOUS

## 2023-12-23 MED ORDER — FENTANYL CITRATE (PF) 250 MCG/5ML IJ SOLN
INTRAMUSCULAR | Status: AC
  Filled 2023-12-23: qty 5

## 2023-12-23 MED ORDER — PROPOFOL 10 MG/ML IV BOLUS
INTRAVENOUS | Status: DC | PRN
  Administered 2023-12-23: 200 mg via INTRAVENOUS

## 2023-12-23 MED ORDER — ACETAMINOPHEN 500 MG PO TABS
1000.0000 mg | ORAL_TABLET | Freq: Once | ORAL | Status: AC
  Administered 2023-12-23: 1000 mg via ORAL

## 2023-12-23 MED ORDER — AMISULPRIDE (ANTIEMETIC) 5 MG/2ML IV SOLN
10.0000 mg | Freq: Once | INTRAVENOUS | Status: AC | PRN
  Administered 2023-12-23: 10 mg via INTRAVENOUS

## 2023-12-23 MED ORDER — BUPIVACAINE HCL 0.5 % IJ SOLN
INTRAMUSCULAR | Status: DC | PRN
  Administered 2023-12-23: 20 mL

## 2023-12-23 SURGICAL SUPPLY — 13 items
CATH ROBINSON RED A/P 16FR (CATHETERS) ×1 IMPLANT
DEVICE MYOSURE LITE (MISCELLANEOUS) IMPLANT
GLOVE ECLIPSE 7.0 STRL STRAW (GLOVE) ×1 IMPLANT
GLOVE SURG UNDER POLY LF SZ7 (GLOVE) ×1 IMPLANT
GOWN STRL REUS W/ TWL LRG LVL3 (GOWN DISPOSABLE) ×2 IMPLANT
NDL SPNL 20GX3.5 QUINCKE YW (NEEDLE) ×1 IMPLANT
NEEDLE SPNL 20GX3.5 QUINCKE YW (NEEDLE) ×1 IMPLANT
PACK VAGINAL MINOR WOMEN LF (CUSTOM PROCEDURE TRAY) ×1 IMPLANT
PAD OB MATERNITY 11 LF (PERSONAL CARE ITEMS) ×1 IMPLANT
SEAL ROD LENS SCOPE MYOSURE (ABLATOR) IMPLANT
SET GENESYS HTA PROCERVA (MISCELLANEOUS) ×1 IMPLANT
TOWEL GREEN STERILE FF (TOWEL DISPOSABLE) ×2 IMPLANT
UNDERPAD 30X36 HEAVY ABSORB (UNDERPADS AND DIAPERS) ×1 IMPLANT

## 2023-12-23 NOTE — Anesthesia Postprocedure Evaluation (Signed)
 Anesthesia Post Note  Patient: Christina Bruce  Procedure(s) Performed: HYSTEROSCOPY WITH FIBROID RESECT (Uterus)     Patient location during evaluation: PACU Anesthesia Type: General Level of consciousness: awake and alert Pain management: pain level controlled Vital Signs Assessment: post-procedure vital signs reviewed and stable Respiratory status: spontaneous breathing, nonlabored ventilation, respiratory function stable and patient connected to nasal cannula oxygen Cardiovascular status: blood pressure returned to baseline and stable Postop Assessment: no apparent nausea or vomiting Anesthetic complications: no  No notable events documented.  Last Vitals:  Vitals:   12/23/23 0830 12/23/23 0845  BP: 110/75 110/73  Pulse: 91 92  Resp: 14 14  Temp:  36.6 C  SpO2: 96% 98%    Last Pain:  Vitals:   12/23/23 0845  TempSrc:   PainSc: Asleep                 Lida Berkery L Vicente Weidler

## 2023-12-23 NOTE — Brief Op Note (Signed)
 12/23/2023  8:02 AM  PATIENT:  Christina Bruce  43 y.o. female  PRE-OPERATIVE DIAGNOSIS:  Abnormal uterine bleeding Uterine mass  POST-OPERATIVE DIAGNOSIS:  Abnormal uterine bleedingUterine mass  PROCEDURE:  Procedure(s): HYSTEROSCOPY WITH FIBROID RESECT (N/A)  SURGEON:  Surgeons and Role:    Lorriane Shire, MD - Primary  PHYSICIAN ASSISTANT: Clement Sayres, PA  ASSISTANTS: none   ANESTHESIA:   general and paracervical block  EBL:  20 ml   BLOOD ADMINISTERED:none  DRAINS: none   LOCAL MEDICATIONS USED:  BUPIVICAINE   SPECIMEN:  Source of Specimen:  endometrial curettings, ?polyp  DISPOSITION OF SPECIMEN:  PATHOLOGY  COUNTS:  YES  TOURNIQUET:  * No tourniquets in log *  DICTATION: .Note written in EPIC  PLAN OF CARE: Discharge to home after PACU  PATIENT DISPOSITION:  PACU - hemodynamically stable.   Delay start of Pharmacological VTE agent (>24hrs) due to surgical blood loss or risk of bleeding: not applicable

## 2023-12-23 NOTE — Op Note (Addendum)
 Christina Bruce PROCEDURE DATE: 12/23/2023  PREOPERATIVE DIAGNOSIS: abnormal uterine bleeding  POSTOPERATIVE DIAGNOSIS: abnormal uterine bleeding PROCEDURE:    operative hysteroscopy, dilation and curettage SURGEON: Lorriane Shire, MD ASSISTANT: none  INDICATIONS: 43 y.o. [id number] with AUB.  Risks of surgery were discussed with the patient including but not limited to: bleeding which may require transfusion; infection which may require antibiotics; injury to surrounding organs; need for additional procedures including laparotomy;  and other postoperative/anesthesia complications. Written informed consent was obtained.    FINDINGS:  Normal external genitalia, normal appearing cervix  Hysteroscopically: possible polypoid lesions within endometrium, atrophic appearing endometrium with trabeculations noted, bilateral tubal ostia visualized   ANESTHESIA: General, paracervial block ESTIMATED BLOOD LOSS:  20 ml INTRAVENOUS FLUIDS: 700 ml SPECIMENS: endometrial curettings, ?polyp COMPLICATIONS:  None immediate.  FLUID DEFICIT: of normal saline  PROCEDURE: The patient was taken to the operating room and placed under general anesthesia. SCDs were in place.  Time out was performed. Patient was placed in dorsolithotomy in [place] stirrups. She was prepped and draped in the usual sterile fashion. A Red Rubber catheter was used to drain her bladder. A speculum was placeed in the vagina. The cervix was visualized anteriorly and grasped with a single-tooth tenaculum. Paracervical block was performed with 0.5% bupivicaine with 20 cc injected. Sequential dilation was performed with Shawnie Pons dilators. The hysteroscope was inserted and the endometrial cavity and inspected. There were the above findings noted in the endometrial cavity with both ostia seen. The myosure light was introduced and used to resect polypoid lesions within the endometrium and endocervical canal.  The hysteroscope was removed. Sharp  curettage was performed in all 4 quadrants. All instruments were removed from the vagina. All instrument, needle and lap counts were correct x2. The patient was awakened and is recovering in stable condition.    Lorriane Shire, MD Minimally Invasive Gynecologic Surgery  Obstetrics and Gynecology, [facility], [facility] 12/23/2023

## 2023-12-23 NOTE — Transfer of Care (Signed)
 Immediate Anesthesia Transfer of Care Note  Patient: Christina Bruce  Procedure(s) Performed: HYSTEROSCOPY WITH FIBROID RESECT (Uterus)  Patient Location: PACU  Anesthesia Type:General  Level of Consciousness: awake, alert , oriented, and patient cooperative  Airway & Oxygen Therapy: Patient Spontanous Breathing  Post-op Assessment: Report given to RN, Post -op Vital signs reviewed and stable, and Patient moving all extremities X 4  Post vital signs: Reviewed and stable  Last Vitals:  Vitals Value Taken Time  BP 123/83 12/23/23 0816  Temp 36.6 C 12/23/23 0815  Pulse 90 12/23/23 0819  Resp 18 12/23/23 0819  SpO2 97 % 12/23/23 0819  Vitals shown include unfiled device data.  Last Pain:  Vitals:   12/23/23 0619  TempSrc:   PainSc: 0-No pain         Complications: No notable events documented.

## 2023-12-23 NOTE — Anesthesia Procedure Notes (Signed)
 Procedure Name: LMA Insertion Date/Time: 12/23/2023 7:42 AM  Performed by: Stanton Kidney, CRNAPre-anesthesia Checklist: Patient identified, Patient being monitored, Timeout performed, Emergency Drugs available and Suction available Patient Re-evaluated:Patient Re-evaluated prior to induction Oxygen Delivery Method: Circle system utilized Preoxygenation: Pre-oxygenation with 100% oxygen Induction Type: IV induction Ventilation: Mask ventilation without difficulty LMA: LMA inserted LMA Size: 4.0 Tube type: Oral Number of attempts: 1 Placement Confirmation: positive ETCO2 and breath sounds checked- equal and bilateral Tube secured with: Tape Dental Injury: Teeth and Oropharynx as per pre-operative assessment  Comments: Atraumatic dentition unchanged

## 2023-12-23 NOTE — Discharge Instructions (Signed)
 Post-surgical Instructions, Outpatient Surgery  You may expect to feel dizzy, weak, and drowsy for as long as 24 hours after receiving the medicine that made you sleep (anesthetic). For the first 24 hours after your surgery:   Do not drive a car, ride a bicycle, participate in physical activities, or take public transportation until you are done taking narcotic pain medicines or as directed by Dr. Briscoe Deutscher.  Do not drink alcohol or take tranquilizers.  Do not take medicine that has not been prescribed by your physicians.  Do not sign important papers or make important decisions while on narcotic pain medicines.  Have a responsible person with you.   PAIN MANAGEMENT Ibuprofen 800mg .  (This is the same as 4-200mg  over the counter tablets of Motrin or ibuprofen.)  Take this every 6 hours or as needed for cramping.   Acetaminophen 1000mg  (This is the same as 2-500mg  over the counter extra strength tylenol). Take this every 6 hours for the first 3 days or as needed afterwards for pain  DO'S AND DON'T'S Do not take a tub bath for 2 weeks.  You may shower on the first day after your surgery Do move around as you feel able.  Stairs are fine.  You may begin to exercise again as you feel able.  Do not lift any weights for two weeks. Do not put anything in the vagina for two weeks--no tampons, intercourse, or douching.    REGULAR MEDIATIONS/VITAMINS: You may restart all of your regular medications as prescribed. You may restart all of your vitamins as you normally take them.    PLEASE CALL OR SEEK MEDICAL CARE IF: You have persistent nausea and vomiting.  You have trouble eating or drinking.  You have an oral temperature above 100.5.  You have constipation that is not helped by adjusting diet or increasing fluid intake. Pain medicines are a common cause of constipation.  You have heavy vaginal bleeding You have redness or drainage from your incision(s) or there is increasing pain or tenderness near  or in the surgical site.

## 2023-12-23 NOTE — H&P (Signed)
 OB/GYN Pre-Op History and Physical  Christina Bruce is a 43 y.o. [id number] presenting for AUB.       Past Medical History:  Diagnosis Date   Anemia    Enlarged heart    Headache(784.0)    Hypertension    Rh negative, maternal     Past Surgical History:  Procedure Laterality Date   CESAREAN SECTION     TUBAL LIGATION Bilateral     OB History  Gravida Para Term Preterm AB Living  6 4 4  1 5   SAB IAB Ectopic Multiple Live Births   0  1     # Outcome Date GA Lbr Len/2nd Weight Sex Type Anes PTL Lv  6 Gravida           5 AB 01/2011 [redacted]w[redacted]d            Birth Comments: no complications  4A Term 11/2006 [redacted]w[redacted]d  3345 g F         Birth Comments: Twins, born at [place], no complications  4B Term 11/2006 [redacted]w[redacted]d  3487 g F CS-Unspec EPI    3 Term 01/2005 [redacted]w[redacted]d  4026 g M CS-Unspec EPI       Birth Comments: c/s due to breech or unable to vaginally deliver- no complications , born at [place]  2 Term 01/2003 [redacted]w[redacted]d  3289 g F Vag-Spont EPI       Birth Comments: no complications, born at [facility]  1 Term 08/2001 [redacted]w[redacted]d  3232 g F Vag-Spont EPI       Birth Comments: no complications, delivered in [place]    Social History   Socioeconomic History   Marital status: Significant Other    Spouse name: Not on file   Number of children: Not on file   Years of education: Not on file   Highest education level: Not on file  Occupational History   Not on file  Tobacco Use   Smoking status: Never    Passive exposure: Never   Smokeless tobacco: Never  Vaping Use   Vaping status: Never Used  Substance and Sexual Activity   Alcohol use: No   Drug use: No   Sexual activity: Yes    Partners: Male    Birth control/protection: Surgical  Other Topics Concern   Not on file  Social History Narrative   Not on file   Social Drivers of Health   Financial Resource Strain: Not on file  Food Insecurity: Not on file  Transportation Needs: Not on file  Physical Activity:  Not on file  Stress: Not on file  Social Connections: Unknown (01/15/2022)   [facility]   Social Network    Social Network: Not on file    Family History  Problem Relation Age of Onset   Hypertension Mother    Diabetes Mother    Hypertension Father    Thyroid disease Sister    Other Neg Hx     Facility-Administered Medications Prior to Admission  Medication Dose Route Frequency Provider Last Rate Last Admin   acetaminophen (TYLENOL) tablet 650 mg  650 mg Oral Once Heilingoetter, Cassandra L, PA-C       diphenhydrAMINE (BENADRYL) capsule 25 mg  25 mg Oral Once Heilingoetter, Cassandra L, PA-C       Medications Prior to Admission  Medication Sig Dispense Refill Last Dose/Taking   amLODipine (NORVASC) 5 MG tablet Take 1 tablet (5 mg total) by mouth  daily. 30 tablet 0 12/23/2023 at  5:00 AM   diphenhydramine-acetaminophen (TYLENOL PM) 25-500 MG TABS tablet Take 2 tablets by mouth at bedtime as needed (pain/sleep).   Past Week   Eszopiclone 3 MG TABS Take 1 tablet (3 mg total) by mouth at bedtime. Take immediately before bedtime 30 tablet 3 Past Month   folic acid (FOLVITE) 1 MG tablet Take 1 tablet (1 mg total) by mouth daily. 90 tablet 0 Past Month   megestrol (MEGACE) 40 MG tablet Take 1 tablet (40 mg total) by mouth 2 (two) times daily. Can increase to two tablets twice a day in the event of heavy bleeding (Patient taking differently: Take 40 mg by mouth 2 (two) times daily as needed (heavy bleeding). Can increase to two tablets twice a day in the event of heavy bleeding) 60 tablet 5 Past Week   naproxen sodium (ALEVE) 220 MG tablet Take 440 mg by mouth daily as needed (pain).   Past Month   neomycin-polymyxin-hydrocortisone (CORTISPORIN) OTIC solution Place 3 drops into both ears 3 (three) times daily. (Patient taking differently: Place 3 drops into both ears 3 (three) times daily as needed (ear itching).) 10 mL 1 Past Week   tirzepatide (MOUNJARO) 7.5  MG/0.5ML Pen Inject 7.5 mg into the skin once a week. 6 mL 3 12/17/2023    Allergies  Allergen Reactions   Metoprolol Other (See Comments)    Drowsiness    Penicillins Hives    Review of Systems: Negative except for what is mentioned in HPI.     Physical Exam: BP 132/86   Pulse 85   Temp 98.6 F (37 C) (Oral)   Resp 18   Ht 4\' 11"  (1.499 m)   Wt 83.5 kg   SpO2 98%   BMI 37.16 kg/m  CONSTITUTIONAL: Well-developed, well-nourished and in no acute distress.  HENT:  Normocephalic, atraumatic, External right and left ear normal. Oropharynx is clear and moist EYES: Conjunctivae and EOM are normal. Pupils are equal, round, and reactive to light. No scleral icterus.  NECK: Normal range of motion, supple, no masses SKIN: Skin is warm and dry. No rash noted. Not diaphoretic. No erythema. No pallor. NEUROLGIC: Alert and oriented to person, place, and time. Normal reflexes, muscle tone coordination. No cranial nerve deficit noted. PSYCHIATRIC: Normal mood and affect. Normal behavior. Normal judgment and thought content. RESPIRATORY: Normal effort PELVIC: Deferred   Pertinent Labs/Studies:   Results for orders placed or performed during the hospital encounter of 12/23/23 (from the past 72 hours)  Basic metabolic panel per protocol     Status: Abnormal   Collection Time: 12/23/23  6:15 AM  Result Value Ref Range   Sodium 138 135 - 145 mmol/L   Potassium 3.8 3.5 - 5.1 mmol/L   Chloride 106 98 - 111 mmol/L   CO2 25 22 - 32 mmol/L   Glucose, Bld 103 (H) 70 - 99 mg/dL    Comment: Glucose reference range applies only to samples taken after fasting for at least 8 hours.   BUN 10 6 - 20 mg/dL   Creatinine, Ser 1.61 0.44 - 1.00 mg/dL   Calcium 9.4 8.9 - 09.6 mg/dL   GFR, Estimated >04 >54 mL/min    Comment: (NOTE) Calculated using the CKD-EPI Creatinine Equation (2021)    Anion gap 7 5 - 15    Comment: Performed at [facility], [address] [place], [place] [address]  CBC  per protocol     Status: Abnormal  Collection Time: 12/23/23  6:15 AM  Result Value Ref Range   WBC 6.9 4.0 - 10.5 K/uL   RBC 4.85 3.87 - 5.11 MIL/uL   Hemoglobin 11.2 (L) 12.0 - 15.0 g/dL   HCT 40.9 (L) 81.1 - 91.4 %   MCV 73.0 (L) 80.0 - 100.0 fL   MCH 23.1 (L) 26.0 - 34.0 pg   MCHC 31.6 30.0 - 36.0 g/dL   RDW 78.2 95.6 - 21.3 %   Platelets 387 150 - 400 K/uL   nRBC 0.0 0.0 - 0.2 %    Comment: Performed at [facility], [address], [place], [place] [address]  Pregnancy, urine POC     Status: None   Collection Time: 12/23/23  6:22 AM  Result Value Ref Range   Preg Test, Ur NEGATIVE NEGATIVE    Comment:        THE SENSITIVITY OF THIS METHODOLOGY IS >24 mIU/mL        Assessment and Plan :Christina Bruce is a 43 y.o. [id number] here for [facility].   Patient desires surgical management with AUB.  The risks of surgery were discussed in detail with the patient including but not limited to: bleeding which may require transfusion or reoperation; infection which may require prolonged hospitalization or re-hospitalization and antibiotic therapy; injury to bowel, bladder, ureters and major vessels or other surrounding organs which may lead to other procedures; formation of adhesions; need for additional procedures including laparotomy or subsequent procedures secondary to intraoperative injury or abnormal pathology; thromboembolic phenomenon; incisional problems and other postoperative or anesthesia complications.  Patient was told that the likelihood that her condition and symptoms will be treated effectively with this surgical management was high; the postoperative expectations were also discussed in detail. The patient also understands the alternative treatment options which were discussed in full. All questions were answered.    Lorriane Shire, M.D. Minimally Invasive Gynecologic Surgery and Pelvic Pain Specialist Attending Obstetrician & Gynecologist, [facility] for  [employer], [facility]

## 2023-12-24 ENCOUNTER — Encounter (HOSPITAL_COMMUNITY): Payer: Self-pay | Admitting: Obstetrics and Gynecology

## 2023-12-24 ENCOUNTER — Encounter: Payer: Self-pay | Admitting: Obstetrics and Gynecology

## 2023-12-24 LAB — SURGICAL PATHOLOGY

## 2023-12-24 NOTE — Telephone Encounter (Signed)
 Reason for CRM: Patient called regarding missed call from [place]. Advised patient will receive a call back before end of day per CAL. Patient stated the prior authorization should be for mounjaro, not Zepbound.

## 2023-12-24 NOTE — Telephone Encounter (Signed)
 Did you already sent the PA team a message?

## 2023-12-24 NOTE — Telephone Encounter (Signed)
 Reason for CRM: [phone] patient called checking status of mounjaro?

## 2023-12-25 ENCOUNTER — Encounter: Payer: Self-pay | Admitting: Internal Medicine

## 2023-12-25 ENCOUNTER — Other Ambulatory Visit (HOSPITAL_COMMUNITY): Payer: Self-pay

## 2023-12-25 NOTE — Telephone Encounter (Signed)
 Patient has been made aware that the PA has been denied. She gave a verbal understanding.

## 2023-12-27 ENCOUNTER — Telehealth: Payer: Self-pay

## 2023-12-27 ENCOUNTER — Other Ambulatory Visit: Payer: Self-pay | Admitting: Internal Medicine

## 2023-12-27 NOTE — Telephone Encounter (Signed)
 Greggory Keen is approved exclusively as an adjunct to diet and exercise to improve glycemic control in adults with type 2 diabetes mellitus. A review of patient's medical chart reveals no documented diagnosis of type 2 diabetes or an A1C indicative of diabetes. Therefore, they do not currently meet the criteria for prior authorization of this medication. If clinically appropriate, alternative options such as Delford Field, or Reginal Lutes may be considered for this patient.

## 2023-12-27 NOTE — Telephone Encounter (Signed)
 Pleaser advise. She also sent a mychart message.

## 2023-12-27 NOTE — Telephone Encounter (Signed)
**Note De-identified  [place] Obfuscation** Please advise 

## 2023-12-30 NOTE — Telephone Encounter (Signed)
 Please advise this message. Christina Bruce has been handling this.

## 2023-12-30 NOTE — Telephone Encounter (Signed)
 Copied from CRM [id number]. Topic: Clinical - Medication Question >> Dec 27, 2023  3:01 PM [place] E wrote: Reason for CRM: Patient called in regarding the denial of her Mounjaro. Patient questioning if she could be started on Zepbound instead. Callback number for patient is [phone] to discuss.

## 2023-12-30 NOTE — Telephone Encounter (Signed)
 Disregard message, patient has already been denied Mounjaro and Zepbound. LVM for patient

## 2024-01-08 ENCOUNTER — Encounter: Payer: Self-pay | Admitting: Emergency Medicine

## 2024-01-08 ENCOUNTER — Other Ambulatory Visit (HOSPITAL_COMMUNITY): Payer: Self-pay

## 2024-01-08 ENCOUNTER — Ambulatory Visit (INDEPENDENT_AMBULATORY_CARE_PROVIDER_SITE_OTHER): Admitting: Emergency Medicine

## 2024-01-08 DIAGNOSIS — Z6837 Body mass index (BMI) 37.0-37.9, adult: Secondary | ICD-10-CM | POA: Diagnosis not present

## 2024-01-08 DIAGNOSIS — L299 Pruritus, unspecified: Secondary | ICD-10-CM | POA: Diagnosis not present

## 2024-01-08 MED ORDER — HYDROCORTISONE-ACETIC ACID 1-2 % OT SOLN
3.0000 [drp] | Freq: Three times a day (TID) | OTIC | 1 refills | Status: AC
  Filled 2024-01-09: qty 10, 22d supply, fill #0

## 2024-01-08 MED ORDER — TIRZEPATIDE-WEIGHT MANAGEMENT 7.5 MG/0.5ML ~~LOC~~ SOAJ
7.5000 mg | SUBCUTANEOUS | 3 refills | Status: DC
  Filled 2024-01-09 – 2024-02-07 (×4): qty 2, 28d supply, fill #0

## 2024-01-08 NOTE — Patient Instructions (Signed)

## 2024-01-08 NOTE — Progress Notes (Signed)
 Christina Bruce 42 y.o.   Chief Complaint  Patient presents with   Medication Refill    Patient here for her Zepbound  refill 7.5mg . she states some how Mounjaro  was sent and it was denied by her insurance. Patient also mentions her ear is back itching and want more or what other options to help with this     HISTORY OF PRESENT ILLNESS: Acute problem visit today. This is a 43 y.o. female complaining of itchiness to left earlobe area for couple days Has been on Zepbound  for about 5 months.  Recently had dose increased to 7.5 mg but ROM prescription was sent to pharmacy. No other complaint or medical concerns today.  Medication Refill Pertinent negatives include no abdominal pain, chest pain, chills, coughing, fever, headaches, nausea, rash or vomiting.     Prior to Admission medications   Medication Sig Start Date End Date Taking? Authorizing Provider  amLODipine  (NORVASC ) 5 MG tablet Take 1 tablet (5 mg total) by mouth daily. 12/20/23  Yes Wellington Half, FNP  Eszopiclone  3 MG TABS Take 1 tablet (3 mg total) by mouth at bedtime. Take immediately before bedtime 12/20/23  Yes Wellington Half, FNP  folic acid  (FOLVITE ) 1 MG tablet Take 1 tablet (1 mg total) by mouth daily. 12/20/23  Yes Wellington Half, FNP  megestrol  (MEGACE ) 40 MG tablet Take 1 tablet (40 mg total) by mouth 2 (two) times daily. Can increase to two tablets twice a day in the event of heavy bleeding Patient taking differently: Take 40 mg by mouth 2 (two) times daily as needed (heavy bleeding). Can increase to two tablets twice a day in the event of heavy bleeding 10/09/23  Yes Stinson, Jacob J, DO  naproxen  sodium (ALEVE ) 220 MG tablet Take 440 mg by mouth daily as needed (pain).   Yes [provider]  neomycin -polymyxin-hydrocortisone  (CORTISPORIN) OTIC solution Place 3 drops into both ears 3 (three) times daily. Patient taking differently: Place 3 drops into both ears 3 (three) times daily as needed  (ear itching). 06/10/23  Yes Arcadio Knuckles, MD  tirzepatide  (MOUNJARO ) 7.5 MG/0.5ML Pen Inject 7.5 mg into the skin once a week. 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   Medication management 06/24/2022   Floaters in visual field, left 06/18/2022   Obesity, morbid, BMI 40.0-49.9 (HCC) 03/15/2022   Dietary folate deficiency anemia 03/15/2022   Menometrorrhagia 03/15/2022   Allergic rhinitis 12/23/2021   Primary hypertension 12/13/2021   Iron deficiency anemia due to chronic blood loss 12/13/2021   LVH (left ventricular hypertrophy) due to hypertensive disease, without heart failure 12/13/2021   Low vitamin B12 level 06/13/2021   Carpal tunnel syndrome of right wrist 12/08/2015   Migraine without aura and without status migrainosus, not intractable 12/06/2015   Depression, major, single episode, moderate (HCC) 07/07/2015   Cervical radiculitis 06/22/2015    Past Medical History:  Diagnosis Date   Anemia    Enlarged heart    Headache(784.0)  Hypertension    Rh negative, maternal     Past Surgical History:  Procedure Laterality Date   CESAREAN SECTION     HYSTEROSCOPY N/A 12/23/2023   Procedure: HYSTEROSCOPY WITH FIBROID RESECT;  Surgeon: Kiki Pelton, MD;  Location: [facility] OR;  Service: Gynecology;  Laterality: N/A;   TUBAL LIGATION Bilateral     Social History   Socioeconomic History   Marital status: Significant Other    Spouse name: Not on file   Number of children: Not on  file   Years of education: Not on file   Highest education level: Not on file  Occupational History   Not on file  Tobacco Use   Smoking status: Never    Passive exposure: Never   Smokeless tobacco: Never  Vaping Use   Vaping status: Never Used  Substance and Sexual Activity   Alcohol use: No   Drug use: No   Sexual activity: Yes    Partners: Male    Birth control/protection: Surgical  Other Topics Concern   Not on file  Social History Narrative   Not on file   Social Drivers of Health   Financial Resource Strain: Not on file  Food Insecurity: Not on file  Transportation Needs: Not on file  Physical Activity: Not on file  Stress: Not on file  Social Connections: Unknown (01/15/2022)   Received from [facility], [facility]   Social Network    Social Network: Not on file  Intimate Partner Violence: Unknown (12/22/2021)   Received from [facility], [facility]   HITS    Physically Hurt: Not on file    Insult or Talk Down To: Not on file    Threaten Physical Harm: Not on file    Scream or Curse: Not on file    Family History  Problem Relation Age of Onset   Hypertension Mother    Diabetes Mother    Hypertension Father    Thyroid disease Sister    Other Neg Hx      Review of Systems  Constitutional:  Negative for chills and fever.  HENT:  Positive for ear pain.   Respiratory: Negative.  Negative for cough and shortness of breath.   Cardiovascular: Negative.  Negative for chest pain and palpitations.  Gastrointestinal:  Negative for abdominal pain, nausea and vomiting.  Genitourinary: Negative.  Negative for dysuria and hematuria.  Skin: Negative.  Negative for rash.  Neurological: Negative.  Negative for dizziness and headaches.    Vitals:   01/08/24 1404  BP: 124/88  Pulse: 95  Temp: 98.8 F (37.1 C)  SpO2: 97%    Physical Exam Vitals reviewed.  Constitutional:      Appearance: Normal appearance.  HENT:     Head: Normocephalic.     Ears:      Comments: Left ear: Dry and scaly outside of external canal.  Normal tympanic membrane Eyes:     Extraocular Movements: Extraocular movements intact.  Cardiovascular:     Rate and Rhythm: Normal rate.  Pulmonary:     Effort: Pulmonary effort is normal.  Skin:    General: Skin is warm and dry.  Neurological:     Mental Status: She is alert and oriented to person, place, and time.  Psychiatric:        Behavior: Behavior normal.      ASSESSMENT & PLAN: A total of 32 minutes was spent with the patient and counseling/coordination of care regarding preparing for this visit, review of most recent  office visit notes, review of chronic medical conditions under management, review of all medications, education and nutrition, cardiovascular risks associated with obesity, prognosis, documentation, need for follow-up.  Problem List Items Addressed This Visit       Other   Obesity, morbid, BMI 40.0-49.9 (HCC) - Primary   Eating better and losing weight. Zepbound  helping. Continue Zepbound  7.5 mg weekly Cardiovascular risks associated with obesity discussed Diet and nutrition discussed Benefits of exercise discussed Follow-up with PCP      Relevant Medications   tirzepatide  7.5 MG/0.5ML injection vial   Itching of ear   Symptom management discussed. Dry scaly flaky skin noted in the outer ear May benefit from Vosol  eardrops      Relevant Medications   acetic acid -hydrocortisone  (VOSOL -HC) OTIC solution   Patient Instructions  Health Maintenance, Female Adopting a healthy lifestyle and getting preventive care are important in promoting health and wellness. Ask your health care provider about: The right schedule for you to have regular tests and exams. Things you can do on your own to prevent diseases and keep yourself healthy. What should I know about diet, weight, and exercise? Eat a healthy diet  Eat a diet that includes plenty of vegetables, fruits, low-fat dairy products,  and lean protein. Do not eat a lot of foods that are high in solid fats, added sugars, or sodium. Maintain a healthy weight Body mass index (BMI) is used to identify weight problems. It estimates body fat based on height and weight. Your health care provider can help determine your BMI and help you achieve or maintain a healthy weight. Get regular exercise Get regular exercise. This is one of the most important things you can do for your health. Most adults should: Exercise for at least 150 minutes each week. The exercise should increase your heart rate and make you sweat (moderate-intensity exercise). Do strengthening exercises at least twice a week. This is in addition to the moderate-intensity exercise. Spend less time sitting. Even light physical activity can be beneficial. Watch cholesterol and blood lipids Have your blood tested for lipids and cholesterol at 41 years of age, then have this test every 5 years. Have your cholesterol levels checked more often if: Your lipid or cholesterol levels are high. You are older than 41 years of age. You are at high risk for heart disease. What should I know about cancer screening? Depending on your health history and family history, you may need to have cancer screening at various ages. This may include screening for: Breast cancer. Cervical cancer. Colorectal cancer. Skin cancer. Lung cancer. What should I know about heart disease, diabetes, and high blood pressure? Blood pressure and heart disease High blood pressure causes heart disease and increases the risk of stroke. This is more likely to develop in people who have high blood pressure readings or are overweight. Have your blood pressure checked: Every 3-5 years if you are 2-4 years of age. Every year if you are 27 years old or older. Diabetes Have regular diabetes screenings. This checks your fasting blood sugar level. Have the screening done: Once every three years after age 79 if  you are at a normal weight and have a low risk for diabetes. More often and at a younger age if you are overweight or have a high risk for diabetes. What should I know about preventing infection? Hepatitis B If you have a higher risk for hepatitis B, you should be screened for this virus. Talk with your health care provider  to find out if you are at risk for hepatitis B infection. Hepatitis C Testing is recommended for: Everyone born from [birth year] through 1963. Anyone with known risk factors for hepatitis C. Sexually transmitted infections (STIs) Get screened for STIs, including gonorrhea and chlamydia, if: You are sexually active and are younger than 41 years of age. You are older than 41 years of age and your health care provider tells you that you are at risk for this type of infection. Your sexual activity has changed since you were last screened, and you are at increased risk for chlamydia or gonorrhea. Ask your health care provider if you are at risk. Ask your health care provider about whether you are at high risk for HIV. Your health care provider may recommend a prescription medicine to help prevent HIV infection. If you choose to take medicine to prevent HIV, you should first get tested for HIV. You should then be tested every 3 months for as long as you are taking the medicine. Pregnancy If you are about to stop having your period (premenopausal) and you may become pregnant, seek counseling before you get pregnant. Take 400 to 800 micrograms (mcg) of folic acid  every day if you become pregnant. Ask for birth control (contraception) if you want to prevent pregnancy. Osteoporosis and menopause Osteoporosis is a disease in which the bones lose minerals and strength with aging. This can result in bone fractures. If you are 53 years old or older, or if you are at risk for osteoporosis and fractures, ask your health care provider if you should: Be screened for bone loss. Take a calcium or  vitamin D supplement to lower your risk of fractures. Be given hormone replacement therapy (HRT) to treat symptoms of menopause. Follow these instructions at home: Alcohol use Do not drink alcohol if: Your health care provider tells you not to drink. You are pregnant, may be pregnant, or are planning to become pregnant. If you drink alcohol: Limit how much you have to: 0-1 drink a day. Know how much alcohol is in your drink. In the [place] one drink equals one 12 oz bottle of beer (355 mL), one 5 oz glass of wine (148 mL), or one 1 oz glass of hard liquor (44 mL). Lifestyle Do not use any products that contain nicotine or tobacco. These products include cigarettes, chewing tobacco, and vaping devices, such as e-cigarettes. If you need help quitting, ask your health care provider. Do not use street drugs. Do not share needles. Ask your health care provider for help if you need support or information about quitting drugs. General instructions Schedule regular health, dental, and eye exams. Stay current with your vaccines. Tell your health care provider if: You often feel depressed. You have ever been abused or do not feel safe at home. Summary Adopting a healthy lifestyle and getting preventive care are important in promoting health and wellness. Follow your health care provider's instructions about healthy diet, exercising, and getting tested or screened for diseases. Follow your health care provider's instructions on monitoring your cholesterol and blood pressure. This information is not intended to replace advice given to you by your health care provider. Make sure you discuss any questions you have with your health care provider. Document Revised: 01/23/2021 Document Reviewed: 01/23/2021 Elsevier Patient Education  2024 Elsevier Inc.     Maryagnes Small, MD [facility]

## 2024-01-08 NOTE — Assessment & Plan Note (Signed)
 Eating better and losing weight. Zepbound  helping. Continue Zepbound  7.5 mg weekly Cardiovascular risks associated with obesity discussed Diet and nutrition discussed Benefits of exercise discussed Follow-up with PCP

## 2024-01-08 NOTE — Assessment & Plan Note (Signed)
 Symptom management discussed. Dry scaly flaky skin noted in the outer ear May benefit from Vosol  eardrops

## 2024-01-09 ENCOUNTER — Other Ambulatory Visit (HOSPITAL_COMMUNITY): Payer: Self-pay

## 2024-01-09 ENCOUNTER — Other Ambulatory Visit: Payer: Self-pay

## 2024-01-09 ENCOUNTER — Telehealth: Payer: Self-pay

## 2024-01-09 MED ORDER — HYDROCORTISONE-ACETIC ACID 1-2 % OT SOLN
3.0000 [drp] | Freq: Three times a day (TID) | OTIC | 1 refills | Status: DC
  Filled 2024-01-09: qty 10, 22d supply, fill #0

## 2024-01-09 MED ORDER — TIRZEPATIDE-WEIGHT MANAGEMENT 7.5 MG/0.5ML ~~LOC~~ SOAJ
7.5000 mg | SUBCUTANEOUS | 3 refills | Status: DC
  Filled 2024-01-09: qty 2, 28d supply, fill #0

## 2024-01-09 NOTE — Telephone Encounter (Signed)
 error

## 2024-01-09 NOTE — Telephone Encounter (Signed)
**Note De-identified  [place] Obfuscation** Please advise 

## 2024-01-09 NOTE — Telephone Encounter (Signed)
 Pharmacy Patient Advocate Encounter   Received notification from Onbase that prior authorization for Zepbound  2.5mg /0.5 ml auto injector is required/requested.   Insurance verification completed.   The patient is insured through [facility] .   Per test claim: Insurance rejected saying anti-obesity drugs are not covered.

## 2024-01-11 ENCOUNTER — Other Ambulatory Visit (HOSPITAL_COMMUNITY): Payer: Self-pay

## 2024-01-13 ENCOUNTER — Other Ambulatory Visit (HOSPITAL_COMMUNITY): Payer: Self-pay

## 2024-01-14 ENCOUNTER — Other Ambulatory Visit (HOSPITAL_COMMUNITY): Payer: Self-pay

## 2024-01-14 ENCOUNTER — Telehealth: Payer: Self-pay

## 2024-01-14 NOTE — Telephone Encounter (Signed)
 Pharmacy Patient Advocate Encounter  Received notification from [facility] that Prior Authorization for Zepbound  7.5MG /0.5ML pen-injectors  has been CANCELLED due to: patients primary plan does not cover any weight loss treatments.

## 2024-01-17 NOTE — Telephone Encounter (Signed)
 Left message that patients medication has been cancelled.

## 2024-01-20 ENCOUNTER — Other Ambulatory Visit (HOSPITAL_COMMUNITY): Payer: Self-pay

## 2024-02-05 ENCOUNTER — Other Ambulatory Visit: Payer: Self-pay | Admitting: Internal Medicine

## 2024-02-05 DIAGNOSIS — I10 Essential (primary) hypertension: Secondary | ICD-10-CM

## 2024-02-07 ENCOUNTER — Other Ambulatory Visit: Payer: Self-pay | Admitting: Internal Medicine

## 2024-02-07 DIAGNOSIS — I10 Essential (primary) hypertension: Secondary | ICD-10-CM

## 2024-02-07 MED ORDER — AMLODIPINE BESYLATE 5 MG PO TABS: 5.0000 mg | ORAL_TABLET | Freq: Every day | ORAL | 0 refills | Status: DC

## 2024-02-07 NOTE — Telephone Encounter (Signed)
 Copied from CRM [id number]. Topic: Clinical - Medication Refill >> Feb 07, 2024  9:40 AM Dorisann Garre T wrote: Medication: amLODipine  (NORVASC ) 5 MG tablet [[id number]]  Has the patient contacted their pharmacy? Yes (Agent: If no, request that the patient contact the pharmacy for the refill. If patient does not wish to contact the pharmacy document the reason why and proceed with request.) (Agent: If yes, when and what did the pharmacy advise?)  This is the patient's preferred pharmacy:  [facility] [address] [place] - [address] [place] [address] Phone: [phone] Fax: [phone]   Is this the correct pharmacy for this prescription? Yes If no, delete pharmacy and type the correct one.   Has the prescription been filled recently? No  Is the patient out of the medication? Yes  Has the patient been seen for an appointment in the last year OR does the patient have an upcoming appointment? Yes  Can we respond through MyChart? Yes  Agent: Please be advised that Rx refills may take up to 3 business days. We ask that you follow-up with your pharmacy.

## 2024-02-08 ENCOUNTER — Other Ambulatory Visit (HOSPITAL_COMMUNITY): Payer: Self-pay

## 2024-02-09 ENCOUNTER — Other Ambulatory Visit: Payer: Self-pay

## 2024-02-11 ENCOUNTER — Telehealth: Payer: Self-pay

## 2024-02-11 ENCOUNTER — Other Ambulatory Visit (HOSPITAL_COMMUNITY): Payer: Self-pay

## 2024-02-11 NOTE — Telephone Encounter (Signed)
 Pharmacy Patient Advocate Encounter   Received notification from CoverMyMeds that prior authorization for Zepbound  7.5 is required/requested.   Insurance verification completed.   The patient is insured through [facility] .   Per test claim: weight loss drugs are not covered. There is a plan benefit exclusion.

## 2024-02-12 NOTE — Telephone Encounter (Signed)
 Patient has been made aware.

## 2024-02-19 ENCOUNTER — Other Ambulatory Visit (HOSPITAL_COMMUNITY): Payer: Self-pay

## 2024-02-26 ENCOUNTER — Other Ambulatory Visit (HOSPITAL_COMMUNITY): Payer: Self-pay

## 2024-02-26 ENCOUNTER — Other Ambulatory Visit: Payer: Self-pay

## 2024-02-27 ENCOUNTER — Other Ambulatory Visit (HOSPITAL_COMMUNITY): Payer: Self-pay

## 2024-03-02 ENCOUNTER — Other Ambulatory Visit (HOSPITAL_COMMUNITY): Payer: Self-pay

## 2024-03-04 ENCOUNTER — Other Ambulatory Visit: Payer: Self-pay | Admitting: Family

## 2024-03-04 DIAGNOSIS — I10 Essential (primary) hypertension: Secondary | ICD-10-CM

## 2024-03-20 ENCOUNTER — Other Ambulatory Visit: Payer: Self-pay | Admitting: Family Medicine

## 2024-03-20 DIAGNOSIS — D520 Dietary folate deficiency anemia: Secondary | ICD-10-CM

## 2024-03-23 ENCOUNTER — Other Ambulatory Visit: Payer: Self-pay | Admitting: Internal Medicine

## 2024-03-23 ENCOUNTER — Ambulatory Visit: Admitting: Internal Medicine

## 2024-03-23 DIAGNOSIS — I10 Essential (primary) hypertension: Secondary | ICD-10-CM

## 2024-03-23 NOTE — Telephone Encounter (Unsigned)
 Copied from CRM [id number]. Topic: Clinical - Medication Refill >> Mar 23, 2024 11:09 AM Franky GRADE wrote: Medication: amLODipine  (NORVASC ) 5 MG tablet [[id number]]  Has the patient contacted their pharmacy? No, patient was scheduled for a virtual appointment today for the refill but was told DR.Joshua does not do virtual visits. Scheduled for 04/09/2024 would like a refill to hold her over until then. (Agent: If no, request that the patient contact the pharmacy for the refill. If patient does not wish to contact the pharmacy document the reason why and proceed with request.) (Agent: If yes, when and what did the pharmacy advise?)  This is the patient's preferred pharmacy:  [facility] [address], [place] - [address] [place] [address] Phone: [phone] Fax: [phone]  Is this the correct pharmacy for this prescription? Yes If no, delete pharmacy and type the correct one.   Has the prescription been filled recently? No  Is the patient out of the medication? Yes  Has the patient been seen for an appointment in the last year OR does the patient have an upcoming appointment? Yes  Can we respond through MyChart? Yes  Agent: Please be advised that Rx refills may take up to 3 business days. We ask that you follow-up with your pharmacy.

## 2024-03-25 MED ORDER — AMLODIPINE BESYLATE 5 MG PO TABS: 5.0000 mg | ORAL_TABLET | Freq: Every day | ORAL | 0 refills | Status: DC

## 2024-04-09 ENCOUNTER — Encounter: Payer: Self-pay | Admitting: Internal Medicine

## 2024-04-09 ENCOUNTER — Ambulatory Visit: Admitting: Internal Medicine

## 2024-04-09 VITALS — BP 146/96 | HR 90 | Temp 98.7°F | Ht 59.0 in | Wt 183.8 lb

## 2024-04-09 DIAGNOSIS — I10 Essential (primary) hypertension: Secondary | ICD-10-CM | POA: Diagnosis not present

## 2024-04-09 DIAGNOSIS — D520 Dietary folate deficiency anemia: Secondary | ICD-10-CM

## 2024-04-09 DIAGNOSIS — R7303 Prediabetes: Secondary | ICD-10-CM | POA: Diagnosis not present

## 2024-04-09 DIAGNOSIS — M5106 Intervertebral disc disorders with myelopathy, lumbar region: Secondary | ICD-10-CM | POA: Diagnosis not present

## 2024-04-09 DIAGNOSIS — D500 Iron deficiency anemia secondary to blood loss (chronic): Secondary | ICD-10-CM

## 2024-04-09 LAB — CBC WITH DIFFERENTIAL/PLATELET
Basophils Absolute: 0 K/uL (ref 0.0–0.1)
Basophils Relative: 0.6 % (ref 0.0–3.0)
Eosinophils Absolute: 0 K/uL (ref 0.0–0.7)
Eosinophils Relative: 0.1 % (ref 0.0–5.0)
HCT: 34.5 % — ABNORMAL LOW (ref 36.0–46.0)
Hemoglobin: 11.2 g/dL — ABNORMAL LOW (ref 12.0–15.0)
Lymphocytes Relative: 33 % (ref 12.0–46.0)
Lymphs Abs: 2.4 K/uL (ref 0.7–4.0)
MCHC: 32.4 g/dL (ref 30.0–36.0)
MCV: 70.3 fl — ABNORMAL LOW (ref 78.0–100.0)
Monocytes Absolute: 0.6 K/uL (ref 0.1–1.0)
Monocytes Relative: 8.3 % (ref 3.0–12.0)
Neutro Abs: 4.1 K/uL (ref 1.4–7.7)
Neutrophils Relative %: 58 % (ref 43.0–77.0)
Platelets: 392 K/uL (ref 150.0–400.0)
RBC: 4.9 Mil/uL (ref 3.87–5.11)
RDW: 17.8 % — ABNORMAL HIGH (ref 11.5–15.5)
WBC: 7.1 K/uL (ref 4.0–10.5)

## 2024-04-09 LAB — BASIC METABOLIC PANEL WITH GFR
BUN: 10 mg/dL (ref 6–23)
CO2: 27 meq/L (ref 19–32)
Calcium: 9.9 mg/dL (ref 8.4–10.5)
Chloride: 102 meq/L (ref 96–112)
Creatinine, Ser: 0.69 mg/dL (ref 0.40–1.20)
GFR: 108.4 mL/min (ref >60.00)
Glucose, Bld: 78 mg/dL (ref 70–99)
Potassium: 3.8 meq/L (ref 3.5–5.1)
Sodium: 137 meq/L (ref 135–145)

## 2024-04-09 LAB — IBC + FERRITIN
Ferritin: 5 ng/mL — ABNORMAL LOW (ref 10.0–291.0)
Iron: 22 ug/dL — ABNORMAL LOW (ref 42–145)
Saturation Ratios: 4.4 % — ABNORMAL LOW (ref 20.0–50.0)
TIBC: 505.4 ug/dL — ABNORMAL HIGH (ref 250.0–450.0)
Transferrin: 361 mg/dL — ABNORMAL HIGH (ref 212.0–360.0)

## 2024-04-09 LAB — HEMOGLOBIN A1C: Hgb A1c MFr Bld: 5.8 % (ref 4.6–6.5)

## 2024-04-09 MED ORDER — TRIAMTERENE-HCTZ 37.5-25 MG PO CAPS: 1.0000 | ORAL_CAPSULE | Freq: Every day | ORAL | 1 refills | Status: AC

## 2024-04-09 MED ORDER — ACCRUFER 30 MG PO CAPS: 1.0000 | ORAL_CAPSULE | Freq: Two times a day (BID) | ORAL | 0 refills | Status: AC

## 2024-04-09 NOTE — Patient Instructions (Signed)
 Hypertension, Adult High blood pressure (hypertension) is when the force of blood pumping through the arteries is too strong. The arteries are the blood vessels that carry blood from the heart throughout the body. Hypertension forces the heart to work harder to pump blood and may cause arteries to become narrow or stiff. Untreated or uncontrolled hypertension can lead to a heart attack, heart failure, a stroke, kidney disease, and other problems. A blood pressure reading consists of a higher number over a lower number. Ideally, your blood pressure should be below 120/80. The first ("top") number is called the systolic pressure. It is a measure of the pressure in your arteries as your heart beats. The second ("bottom") number is called the diastolic pressure. It is a measure of the pressure in your arteries as the heart relaxes. What are the causes? The exact cause of this condition is not known. There are some conditions that result in high blood pressure. What increases the risk? Certain factors may make you more likely to develop high blood pressure. Some of these risk factors are under your control, including: Smoking. Not getting enough exercise or physical activity. Being overweight. Having too much fat, sugar, calories, or salt (sodium) in your diet. Drinking too much alcohol. Other risk factors include: Having a personal history of heart disease, diabetes, high cholesterol, or kidney disease. Stress. Having a family history of high blood pressure and high cholesterol. Having obstructive sleep apnea. Age. The risk increases with age. What are the signs or symptoms? High blood pressure may not cause symptoms. Very high blood pressure (hypertensive crisis) may cause: Headache. Fast or irregular heartbeats (palpitations). Shortness of breath. Nosebleed. Nausea and vomiting. Vision changes. Severe chest pain, dizziness, and seizures. How is this diagnosed? This condition is diagnosed by  measuring your blood pressure while you are seated, with your arm resting on a flat surface, your legs uncrossed, and your feet flat on the floor. The cuff of the blood pressure monitor will be placed directly against the skin of your upper arm at the level of your heart. Blood pressure should be measured at least twice using the same arm. Certain conditions can cause a difference in blood pressure between your right and left arms. If you have a high blood pressure reading during one visit or you have normal blood pressure with other risk factors, you may be asked to: Return on a different day to have your blood pressure checked again. Monitor your blood pressure at home for 1 week or longer. If you are diagnosed with hypertension, you may have other blood or imaging tests to help your health care provider understand your overall risk for other conditions. How is this treated? This condition is treated by making healthy lifestyle changes, such as eating healthy foods, exercising more, and reducing your alcohol intake. You may be referred for counseling on a healthy diet and physical activity. Your health care provider may prescribe medicine if lifestyle changes are not enough to get your blood pressure under control and if: Your systolic blood pressure is above 130. Your diastolic blood pressure is above 80. Your personal target blood pressure may vary depending on your medical conditions, your age, and other factors. Follow these instructions at home: Eating and drinking  Eat a diet that is high in fiber and potassium, and low in sodium, added sugar, and fat. An example of this eating plan is called the DASH diet. DASH stands for Dietary Approaches to Stop Hypertension. To eat this way: Eat  plenty of fresh fruits and vegetables. Try to fill one half of your plate at each meal with fruits and vegetables. Eat whole grains, such as whole-wheat pasta, brown rice, or whole-grain bread. Fill about one  fourth of your plate with whole grains. Eat or drink low-fat dairy products, such as skim milk or low-fat yogurt. Avoid fatty cuts of meat, processed or cured meats, and poultry with skin. Fill about one fourth of your plate with lean proteins, such as fish, chicken without skin, beans, eggs, or tofu. Avoid pre-made and processed foods. These tend to be higher in sodium, added sugar, and fat. Reduce your daily sodium intake. Many people with hypertension should eat less than 1,500 mg of sodium a day. Do not drink alcohol if: Your health care provider tells you not to drink. You are pregnant, may be pregnant, or are planning to become pregnant. If you drink alcohol: Limit how much you have to: 0-1 drink a day for women. 0-2 drinks a day for men. Know how much alcohol is in your drink. In the [place] one drink equals one 12 oz bottle of beer (355 mL), one 5 oz glass of wine (148 mL), or one 1 oz glass of hard liquor (44 mL). Lifestyle  Work with your health care provider to maintain a healthy body weight or to lose weight. Ask what an ideal weight is for you. Get at least 30 minutes of exercise that causes your heart to beat faster (aerobic exercise) most days of the week. Activities may include walking, swimming, or biking. Include exercise to strengthen your muscles (resistance exercise), such as Pilates or lifting weights, as part of your weekly exercise routine. Try to do these types of exercises for 30 minutes at least 3 days a week. Do not use any products that contain nicotine or tobacco. These products include cigarettes, chewing tobacco, and vaping devices, such as e-cigarettes. If you need help quitting, ask your health care provider. Monitor your blood pressure at home as told by your health care provider. Keep all follow-up visits. This is important. Medicines Take over-the-counter and prescription medicines only as told by your health care provider. Follow directions carefully. Blood  pressure medicines must be taken as prescribed. Do not skip doses of blood pressure medicine. Doing this puts you at risk for problems and can make the medicine less effective. Ask your health care provider about side effects or reactions to medicines that you should watch for. Contact a health care provider if you: Think you are having a reaction to a medicine you are taking. Have headaches that keep coming back (recurring). Feel dizzy. Have swelling in your ankles. Have trouble with your vision. Get help right away if you: Develop a severe headache or confusion. Have unusual weakness or numbness. Feel faint. Have severe pain in your chest or abdomen. Vomit repeatedly. Have trouble breathing. These symptoms may be an emergency. Get help right away. Call 911. Do not wait to see if the symptoms will go away. Do not drive yourself to the hospital. Summary Hypertension is when the force of blood pumping through your arteries is too strong. If this condition is not controlled, it may put you at risk for serious complications. Your personal target blood pressure may vary depending on your medical conditions, your age, and other factors. For most people, a normal blood pressure is less than 120/80. Hypertension is treated with lifestyle changes, medicines, or a combination of both. Lifestyle changes include losing weight, eating a healthy,  low-sodium diet, exercising more, and limiting alcohol. This information is not intended to replace advice given to you by your health care provider. Make sure you discuss any questions you have with your health care provider. Document Revised: 07/11/2021 Document Reviewed: 07/11/2021 Elsevier Patient Education  2024 [employer].

## 2024-04-09 NOTE — Progress Notes (Signed)
 Subjective:  Patient ID: Christina Bruce, female    DOB: [birth year]  Age: 43 y.o. MRN: [id number]  CC: Anemia, Hypertension, and Back Pain   HPI [name] presents for f/up ----  Discussed the use of AI scribe software for clinical note transcription with the patient, who gave verbal consent to proceed.  History of Present Illness Christina Bruce is a 43 year old female with hypertension who presents for follow-up on weight management and blood pressure control.  She has achieved significant weight loss, reducing from 220 pounds to 183 pounds, primarily through the use of Zepbound  and exercise. However, she is no longer using Zepbound  due to insurance coverage issues and is seeking alternative weight management options.  Her blood pressure remains elevated despite taking 5 mg of her current medication. No headaches or blurred vision, but her blood pressure was elevated during a recent check. She took her medication early in the morning due to her work schedule.  She has a history of anemia, previously addressed with a procedure that removed a polyp and a fibroid. Currently taking iron and folate supplements. No weakness, dizziness, or lightheadedness.  She experiences back pain, with pain radiating into her left leg. She reports being told she had a slipped disc after a prior evaluation. Despite weight loss, the pain has worsened. She does not take any pain medication for this issue.  She reports insomnia but denies symptoms of sleep apnea such as snoring. Her snoring has decreased with weight loss.  Her menstrual cycles were previously long and heavy, but she underwent a procedure a few months ago to address this issue. No abdominal pain post-procedure.  She was noted to be prediabetic last September and is due for lab work to check her current status.    Outpatient Medications Prior to Visit  Medication Sig Dispense Refill   acetic acid -hydrocortisone  (VOSOL -HC) OTIC solution  Place 3 drops into the left ear 3 (three) times daily. 10 mL 1   amLODipine  (NORVASC ) 5 MG tablet Take 1 tablet (5 mg total) by mouth daily. 30 tablet 0   Eszopiclone  3 MG TABS Take 1 tablet (3 mg total) by mouth at bedtime. Take immediately before bedtime 30 tablet 3   folic acid  (FOLVITE ) 1 MG tablet Take 1 tablet by mouth once daily 90 tablet 0   megestrol  (MEGACE ) 40 MG tablet Take 1 tablet (40 mg total) by mouth 2 (two) times daily. Can increase to two tablets twice a day in the event of heavy bleeding (Patient taking differently: Take 40 mg by mouth 2 (two) times daily as needed (heavy bleeding). Can increase to two tablets twice a day in the event of heavy bleeding) 60 tablet 5   naproxen  sodium (ALEVE ) 220 MG tablet Take 440 mg by mouth daily as needed (pain).     tirzepatide  (ZEPBOUND ) 7.5 MG/0.5ML Pen Inject 7.5 mg into the skin once a week. 2 mL 3   No facility-administered medications prior to visit.    ROS Review of Systems  Constitutional:  Negative for appetite change, chills, diaphoresis, fatigue and fever.  HENT: Negative.    Eyes: Negative.   Respiratory: Negative.  Negative for apnea, cough, chest tightness, shortness of breath and wheezing.   Cardiovascular:  Negative for chest pain, palpitations and leg swelling.  Gastrointestinal: Negative.  Negative for abdominal pain, constipation, diarrhea, nausea and vomiting.  Genitourinary:  Positive for menstrual problem. Negative for difficulty urinating.  Musculoskeletal:  Positive for back pain. Negative  for arthralgias, joint swelling and myalgias.  Skin: Negative.   Neurological:  Negative for dizziness, weakness, light-headedness and headaches.  Hematological:  Negative for adenopathy. Does not bruise/bleed easily.  Psychiatric/Behavioral:  Positive for sleep disturbance.     Objective:  BP (!) 146/96 (BP Location: Left Arm, Patient Position: Sitting, Cuff Size: Normal)   Pulse 90   Temp 98.7 F (37.1 C) (Oral)   Ht  4' 11 (1.499 m)   Wt 183 lb 12.8 oz (83.4 kg)   LMP 03/29/2024   SpO2 99%   BMI 37.12 kg/m   BP Readings from Last 3 Encounters:  04/09/24 (!) 146/96  01/08/24 124/88  12/23/23 110/73    Wt Readings from Last 3 Encounters:  04/09/24 183 lb 12.8 oz (83.4 kg)  01/08/24 185 lb (83.9 kg)  12/23/23 184 lb (83.5 kg)    Physical Exam Vitals reviewed.  Constitutional:      General: She is not in acute distress.    Appearance: She is obese. She is not toxic-appearing or diaphoretic.  HENT:     Nose: Nose normal.     Mouth/Throat:     Mouth: Mucous membranes are moist.  Eyes:     General: No scleral icterus.    Conjunctiva/sclera: Conjunctivae normal.  Cardiovascular:     Rate and Rhythm: Normal rate and regular rhythm.     Heart sounds: No murmur heard.    No friction rub. No gallop.  Pulmonary:     Effort: Pulmonary effort is normal.     Breath sounds: No stridor. No wheezing, rhonchi or rales.  Abdominal:     General: Abdomen is flat.     Palpations: There is no mass.     Tenderness: There is no abdominal tenderness. There is no guarding.     Hernia: No hernia is present.  Musculoskeletal:        General: Normal range of motion.     Cervical back: Neck supple.     Right lower leg: No edema.     Left lower leg: No edema.  Lymphadenopathy:     Cervical: No cervical adenopathy.  Skin:    General: Skin is warm and dry.  Neurological:     General: No focal deficit present.     Mental Status: She is alert.  Psychiatric:        Mood and Affect: Mood normal.        Behavior: Behavior normal.     Lab Results  Component Value Date   WBC 7.1 04/09/2024   HGB 11.2 (L) 04/09/2024   HCT 34.5 (L) 04/09/2024   PLT 392.0 04/09/2024   GLUCOSE 78 04/09/2024   ALT 19 10/06/2023   AST 22 10/06/2023   NA 137 04/09/2024   K 3.8 04/09/2024   CL 102 04/09/2024   CREATININE 0.69 04/09/2024   BUN 10 04/09/2024   CO2 27 04/09/2024   TSH 1.33 06/10/2023   HGBA1C 5.8  04/09/2024    No results found.  Assessment & Plan:  Dietary folate deficiency anemia -     CBC with Differential/Platelet; Future  Iron deficiency anemia due to chronic blood loss -     IBC + Ferritin; Future -     CBC with Differential/Platelet; Future -     ACCRUFeR ; Take 1 capsule (30 mg total) by mouth in the morning and at bedtime.  Dispense: 180 capsule; Refill: 0  Primary hypertension- Will try to achieve better BP control. -  Basic metabolic panel with GFR; Future -     Triamterene -HCTZ; Take 1 each (1 capsule total) by mouth daily.  Dispense: 90 capsule; Refill: 1  Prediabetes -     Basic metabolic panel with GFR; Future -     Hemoglobin A1c; Future  Lumbar disc herniation with myelopathy -     Ambulatory referral to Physical Medicine Rehab     Follow-up: Return in about 6 months (around 10/10/2024).  Debby Molt, MD

## 2024-04-10 ENCOUNTER — Other Ambulatory Visit (HOSPITAL_COMMUNITY): Payer: Self-pay

## 2024-04-10 ENCOUNTER — Ambulatory Visit: Payer: Self-pay | Admitting: Internal Medicine

## 2024-04-10 ENCOUNTER — Other Ambulatory Visit: Payer: Self-pay | Admitting: Internal Medicine

## 2024-04-10 MED ORDER — PHENTERMINE HCL 37.5 MG PO CAPS: 37.5000 mg | ORAL_CAPSULE | ORAL | 0 refills | Status: AC

## 2024-04-15 ENCOUNTER — Other Ambulatory Visit (HOSPITAL_COMMUNITY): Payer: Self-pay

## 2024-04-19 ENCOUNTER — Other Ambulatory Visit: Payer: Self-pay | Admitting: Internal Medicine

## 2024-04-19 DIAGNOSIS — I10 Essential (primary) hypertension: Secondary | ICD-10-CM

## 2024-04-21 NOTE — Telephone Encounter (Signed)
 Last OV 04/09/24 Next OV not scheduled  Last refill 03/25/24 Qty #30/0

## 2024-04-22 ENCOUNTER — Encounter: Payer: Self-pay | Admitting: Physical Medicine & Rehabilitation

## 2024-05-11 ENCOUNTER — Telehealth: Payer: Self-pay

## 2024-05-11 NOTE — Telephone Encounter (Signed)
 Copied from CRM #[id number]. 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 PT needs a note stating pcp have her on the medication for weight  loss.  Pt callback # is [phone]

## 2024-05-12 NOTE — Telephone Encounter (Signed)
 Copied from CRM #[id number]. Topic: General - Other >> May 12, 2024 10:56 AM Jasmin G wrote: Reason for CRM: Pt called regarding the status of her inquiry from yesterday, please refer to Encounter from 8/25 at 10:10 a.m, I informed her of the usual turnaround time for paperwork of 7-10 business days and offered to sen a message to clinic for them to give her a call back and give a more accurate turnaround time, please call her back at [phone], it's okay to leave a message.

## 2024-05-13 NOTE — Telephone Encounter (Signed)
**Note De-identified  [place] Obfuscation** Please advise 

## 2024-05-15 ENCOUNTER — Encounter: Payer: Self-pay | Admitting: Internal Medicine

## 2024-05-15 NOTE — Telephone Encounter (Signed)
 Patient has been made aware. Letter has been placed up front for pickup

## 2024-06-09 ENCOUNTER — Other Ambulatory Visit: Payer: Self-pay | Admitting: Family Medicine

## 2024-06-17 ENCOUNTER — Encounter: Attending: Physical Medicine & Rehabilitation | Admitting: Physical Medicine & Rehabilitation

## 2024-06-17 ENCOUNTER — Encounter: Payer: Self-pay | Admitting: Physical Medicine & Rehabilitation

## 2024-06-17 VITALS — BP 143/89 | HR 73 | Ht 59.0 in | Wt 181.0 lb

## 2024-06-17 DIAGNOSIS — M5412 Radiculopathy, cervical region: Secondary | ICD-10-CM | POA: Insufficient documentation

## 2024-06-17 DIAGNOSIS — G5601 Carpal tunnel syndrome, right upper limb: Secondary | ICD-10-CM | POA: Diagnosis present

## 2024-06-17 DIAGNOSIS — M47816 Spondylosis without myelopathy or radiculopathy, lumbar region: Secondary | ICD-10-CM | POA: Diagnosis present

## 2024-06-17 MED ORDER — PREGABALIN 25 MG PO CAPS: 25.0000 mg | ORAL_CAPSULE | Freq: Two times a day (BID) | ORAL | 3 refills | Status: AC

## 2024-06-17 MED ORDER — DICLOFENAC SODIUM 50 MG PO TBEC: 50.0000 mg | DELAYED_RELEASE_TABLET | Freq: Two times a day (BID) | ORAL | 3 refills | Status: AC

## 2024-06-17 NOTE — Progress Notes (Signed)
 [name] is presenting today with complaints of chronic back pain. She was kindly referred to us  by Dr. Debby Molt. She has had chronic low back pain with radiation down her right leg. [name]  lost significant weight with Zepbound  and exercise over the last several months. The medication is no longer covered and she's taking phentermine  currently.  I found an MRI of her lumbar spine from 10/23 which revealed the following:   1. Right subarticular disc protrusion at L5-S1, contacting and mildly displacing the descending right S1 nerve root. 2. Mild to moderate facet hypertrophy at L2-3 through L5-S1, which could contribute to underlying back pain. 3. Small right paracentral disc protrusions at T10-11 and T11-12 without significant stenosis  She saw ortho who recommended weight loss and gave her tylenol  #3, NSAID, and gabapentin which she's completely off of. Her low back and leg pain Has improved  but over the summer she began to develop right arm dysesthesias.    Around 2017 she suffered a fall and injured her neck and suffered nerve damage with symptoms in the right arm at that time. She received an epidural injection apparently at that time. I found an MRI from that time as well:    Mild disc bulges at C5-6 and C6-7. Upper thoracic degenerative changes, not studied in detail, but unlikely to relate to right shoulder symptoms.  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   The recurrent symptoms this summer are similar to the 2017 injury and are associated with weakness. Sensory loss is greatest on thumb side of hand. She's taking nothing except tylenol  for pain.    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 She typically works 40 hours per week.     Pain Inventory Average Pain 7 Pain Right Now 7 My pain is constant and aching, numbness with pain  In the last 24  hours, has pain interfered with the following? General activity 10 Relation with others 10 Enjoyment of life 10 What TIME of day is your pain at its worst? evening, varies Sleep (in general) Fair  Pain is worse with: some activites and movement of right arm Pain improves with: very little improvement with rest or meds Relief from Meds: 1  walk without assistance how many minutes can you walk? 40 ability to climb steps?  yes do you drive?  yes Do you have any goals in this area?  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  yes  Primary Care:  Debby Molt, MD Ortho:  [facility]    Family History  Problem Relation Age of Onset   Hypertension Mother    Diabetes Mother    Hypertension Father    Thyroid disease Sister    Other Neg Hx    Social History   Socioeconomic History   Marital status: Significant Other    Spouse name: Not on file   Number of children: Not on file   Years of education: Not on file   Highest education level: Not on file  Occupational History   Not on file  Tobacco Use   Smoking status: Never    Passive exposure: Never   Smokeless tobacco: Never  Vaping Use   Vaping status: Never Used  Substance  and Sexual Activity   Alcohol use: No   Drug use: No   Sexual activity: Yes    Partners: Male    Birth control/protection: Surgical  Other Topics Concern   Not on file  Social History Narrative   Not on file   Social Drivers of Health   Financial Resource Strain: Not on file  Food Insecurity: Not on file  Transportation Needs: Not on file  Physical Activity: Not on file  Stress: Not on file  Social Connections: Unknown (01/15/2022)   Received from [facility]   Social Network    Social Network: Not on file   Past Surgical History:  Procedure Laterality Date   CESAREAN SECTION     HYSTEROSCOPY N/A 12/23/2023   Procedure: HYSTEROSCOPY WITH  FIBROID RESECT;  Surgeon: Jeralyn Crutch, MD;  Location: [facility] OR;  Service: Gynecology;  Laterality: N/A;   TUBAL LIGATION Bilateral    Past Medical History:  Diagnosis Date   Anemia    Enlarged heart    Headache(784.0)    Hypertension    Rh negative, maternal    BP (!) 143/89 (BP Location: Left Arm, Patient Position: Sitting, Cuff Size: Large)   Pulse 73   Ht 4' 11 (1.499 m)   Wt 181 lb (82.1 kg)   SpO2 98%   BMI 36.56 kg/m   Opioid Risk Score:   Fall Risk Score:  `1  Depression screen PHQ 2/9     06/17/2024    2:11 PM 04/09/2024    2:06 PM 01/08/2024    2:13 PM 08/07/2023    3:20 PM 12/13/2021    8:34 AM  Depression screen PHQ 2/9  Decreased Interest 0 0 0 0 0  Down, Depressed, Hopeless 0 0 0 0 0  PHQ - 2 Score 0 0 0 0 0  Altered sleeping 0 0     Tired, decreased energy 0 0     Change in appetite 0 0     Feeling bad or failure about yourself  0 0     Trouble concentrating 0 0     Moving slowly or fidgety/restless 0 0     Suicidal thoughts 0 0     PHQ-9 Score 0 0     Difficult doing work/chores Not difficult at all Not difficult at all        Subjective:    Patient ID: Christina Bruce, female    DOB: Sep 08, 1981, 43 y.o.   MRN: [id number]  HPI    Review of Systems  Genitourinary:  Negative for frequency.  Musculoskeletal:  Positive for back pain and myalgias.       Lumbar pain, right arm weakness, numbness, pain  Neurological:        Right arm Tingling, spasms       Objective:   Physical Exam Gen: no distress, normal appearing HEENT: oral mucosa pink and moist, NCAT Cardio: Reg rate Chest: normal effort, normal rate of breathing Abd: soft, non-distended Ext: no edema Psych: pleasant, normal affect Skin: intact Neuro: Alert and oriented x 3. Normal insight and awareness. Intact Memory. Normal language and speech. Cranial nerve exam unremarkable. MMT: She appears to be 5 out of 5 in all muscles tested in the upper and lower extremities.  She does  have some sensory loss most profound along the median half of the right hand.  She seems to have lesser sensory loss over the remaining hand and forearm.  Tinel's test positive at the wrist, Phalen's test positive  at the right wrist as well.  Tinel's test negative at the elbow.  Spurling's test was equivocal to positive on the right side.SABRA  DTRs were trace to absent on the right and 1+ on the left upper extremity. Musculoskeletal: Patient with some generalized discomfort with shoulder active and passive range of motion especially in internal rotation and external rotation.  Some generalized tenderness to palpation over the cervical and shoulder girdle musculature without focal trigger points.  Cervical range of motion was fairly well-preserved.  Low back with minimal tenderness.  She seemed to have functional range of motion in the lumbar spine today.  Provocative maneuvers were negative.       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  I think this is largely accounting for the symptoms in her hand. She also has a history of a cervical injury in 2017.  Imaging from that time was not revealing but someone thought enough to try an epidural injection at some point there.  Exam today could be consistent with radiculopathy in the middle to lower cervical spine.     Plan: Xrays of cervical spine.  Will ultimately likely need an MRI of her neck pending on progression EMG/NCS RUE to assess severity of carpal tunnel syndrome and to rule out cervical radiculopathy Daily wrist splint splint even while she is working.  She might want to try to wrap the splint and Saran wrap to keep it clean when she is working around food. Trial of lyrica 25mg  bid.  She will start at 25 mg at bedtime initially Trial of diclofenac 50mg  bid with  food  Forty-five minutes of face to face patient care time were spent during this visit. All questions were encouraged and answered. Follow up with me in 2 mos.

## 2024-06-17 NOTE — Patient Instructions (Addendum)
 ALWAYS FEEL FREE TO CALL OUR OFFICE WITH ANY PROBLEMS OR QUESTIONS [phone])  **PLEASE NOTE** ALL MEDICATION REFILL REQUESTS (INCLUDING CONTROLLED SUBSTANCES) NEED TO BE MADE AT LEAST 7 DAYS PRIOR TO REFILL BEING DUE. ANY REFILL REQUESTS INSIDE THAT TIME FRAME MAY RESULT IN DELAYS IN RECEIVING YOUR PRESCRIPTION.      TAKE LYRICA 25MG  AT BEDTIME FOR 4 DAYS THEN TWICE DAILY

## 2024-06-18 ENCOUNTER — Other Ambulatory Visit: Payer: Self-pay | Admitting: Family Medicine

## 2024-06-18 DIAGNOSIS — D520 Dietary folate deficiency anemia: Secondary | ICD-10-CM

## 2024-07-31 ENCOUNTER — Encounter: Admitting: Physical Medicine & Rehabilitation

## 2024-10-20 ENCOUNTER — Ambulatory Visit: Admitting: Adult Health

## 2024-10-22 ENCOUNTER — Ambulatory Visit: Admitting: Cardiology
